# Patient Record
Sex: Female | Born: 2000 | Race: White | Hispanic: No | Marital: Single | Smoking: Never smoker
Health system: Southern US, Community
[De-identification: ages and names within clinical notes are randomized; demographics above are authoritative.]

## PROBLEM LIST (undated history)

## (undated) DIAGNOSIS — F32A Depression, unspecified: Secondary | ICD-10-CM

## (undated) DIAGNOSIS — M419 Scoliosis, unspecified: Secondary | ICD-10-CM

## (undated) DIAGNOSIS — T7840XA Allergy, unspecified, initial encounter: Secondary | ICD-10-CM

## (undated) DIAGNOSIS — F329 Major depressive disorder, single episode, unspecified: Secondary | ICD-10-CM

## (undated) DIAGNOSIS — J45909 Unspecified asthma, uncomplicated: Secondary | ICD-10-CM

## (undated) DIAGNOSIS — F319 Bipolar disorder, unspecified: Secondary | ICD-10-CM

## (undated) DIAGNOSIS — F419 Anxiety disorder, unspecified: Secondary | ICD-10-CM

## (undated) HISTORY — DX: Unspecified asthma, uncomplicated: J45.909

## (undated) HISTORY — DX: Allergy, unspecified, initial encounter: T78.40XA

## (undated) HISTORY — PX: TONSILLECTOMY: SUR1361

## (undated) HISTORY — DX: Bipolar disorder, unspecified: F31.9

## (undated) HISTORY — DX: Depression, unspecified: F32.A

## (undated) HISTORY — DX: Scoliosis, unspecified: M41.9

## (undated) HISTORY — DX: Anxiety disorder, unspecified: F41.9

## (undated) HISTORY — DX: Major depressive disorder, single episode, unspecified: F32.9

---
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
## 2021-01-18 ENCOUNTER — Other Ambulatory Visit: Payer: Self-pay | Admitting: Nurse Practitioner

## 2021-01-19 NOTE — Telephone Encounter (Signed)
Requested Prescriptions  Pending Prescriptions Disp Refills  . budesonide-formoterol (SYMBICORT) 160-4.5 MCG/ACT inhaler [Pharmacy Med Name: BUDESONIDE/FORM 160/4.5MCG(120 INH)] 10.2 g 5    Sig: INHALE 2 PUFFS INTO THE LUNGS TWICE DAILY     Pulmonology:  Combination Products Passed - 01/18/2021  6:27 PM      Passed - Valid encounter within last 12 months    Recent Outpatient Visits          2 months ago Migraine variant with headache   [facility] Larae Grooms, NP   3 months ago Screening for STD (sexually transmitted disease)   [facility] Larae Grooms, NP   6 months ago Annual physical exam   [facility] Larae Grooms, NP   7 months ago Migraine variant with headache   [name] Larae Grooms, NP   8 months ago Migraine variant with headache   [facility] Marjie Skiff, NP      Future Appointments            In 4 months Larae Grooms, NP [facility]

## 2021-01-27 ENCOUNTER — Ambulatory Visit: Payer: Self-pay

## 2021-01-27 NOTE — Telephone Encounter (Signed)
Pt. Started coughing last week. States cough is "actually better, but I'm having shortness of breath with exertion and I'm using my rescue inhaler more." Dry cough. Home COVID 19 test negative today. Appointment made for tomorrow. Instructed to go to ED for worsening of symptoms.  Reason for Disposition  [1] MILD difficulty breathing (e.g., minimal/no SOB at rest, SOB with walking, pulse <100) AND [2] still present when not coughing  Answer Assessment - Initial Assessment Questions 1. ONSET: "When did the cough begin?"      Last week  2. SEVERITY: "How bad is the cough today?"      Mild 3. SPUTUM: "Describe the color of your sputum" (none, dry cough; clear, white, yellow, green)     N/a 4. HEMOPTYSIS: "Are you coughing up any blood?" If so ask: "How much?" (flecks, streaks, tablespoons, etc.)     No 5. DIFFICULTY BREATHING: "Are you having difficulty breathing?" If Yes, ask: "How bad is it?" (e.g., mild, moderate, severe)    - MILD: No SOB at rest, mild SOB with walking, speaks normally in sentences, can lie down, no retractions, pulse < 100.    - MODERATE: SOB at rest, SOB with minimal exertion and prefers to sit, cannot lie down flat, speaks in phrases, mild retractions, audible wheezing, pulse 100-120.    - SEVERE: Very SOB at rest, speaks in single words, struggling to breathe, sitting hunched forward, retractions, pulse > 120      Minimal exertion - stairs 6. FEVER: "Do you have a fever?" If Yes, ask: "What is your temperature, how was it measured, and when did it start?"     No 7. CARDIAC HISTORY: "Do you have any history of heart disease?" (e.g., heart attack, congestive heart failure)      No 8. LUNG HISTORY: "Do you have any history of lung disease?"  (e.g., pulmonary embolus, asthma, emphysema)     Asthma 9. PE RISK FACTORS: "Do you have a history of blood clots?" (or: recent major surgery, recent prolonged travel, bedridden)     No 10. OTHER SYMPTOMS: "Do you have any other  symptoms?" (e.g., runny nose, wheezing, chest pain)       No 11. PREGNANCY: "Is there any chance you are pregnant?" "When was your last menstrual period?"       No 12. TRAVEL: "Have you traveled out of the country in the last month?" (e.g., travel history, exposures)       No  Protocols used: Cough - Acute Non-Productive-A-AH

## 2021-01-28 ENCOUNTER — Other Ambulatory Visit: Payer: Self-pay

## 2021-01-28 ENCOUNTER — Ambulatory Visit (INDEPENDENT_AMBULATORY_CARE_PROVIDER_SITE_OTHER): Payer: Medicaid Other | Admitting: Nurse Practitioner

## 2021-01-28 ENCOUNTER — Encounter: Payer: Self-pay | Admitting: Nurse Practitioner

## 2021-01-28 VITALS — BP 114/74 | HR 101 | Temp 98.1°F | Ht 64.5 in | Wt 131.0 lb

## 2021-01-28 DIAGNOSIS — J4541 Moderate persistent asthma with (acute) exacerbation: Secondary | ICD-10-CM

## 2021-01-28 MED ORDER — TRIAMCINOLONE ACETONIDE 40 MG/ML IJ SUSP
40.0000 mg | Freq: Once | INTRAMUSCULAR | Status: AC
  Administered 2021-01-28: 40 mg via INTRAMUSCULAR

## 2021-01-28 MED ORDER — PREDNISONE 10 MG PO TABS: ORAL_TABLET | ORAL | 0 refills | Status: DC

## 2021-01-28 MED ORDER — ALBUTEROL SULFATE (2.5 MG/3ML) 0.083% IN NEBU
2.5000 mg | INHALATION_SOLUTION | Freq: Once | RESPIRATORY_TRACT | Status: AC
Start: 2021-01-28 — End: 2021-01-28
  Administered 2021-01-28: 2.5 mg via RESPIRATORY_TRACT

## 2021-01-28 NOTE — Progress Notes (Signed)
Acute Office Visit  Subjective:    Patient ID: Kelsey Aguirre, female    DOB: [birth year], 20 y.o.   MRN: [id number]  Chief Complaint  Patient presents with   Cough    With shortness of breath. Sore throat last Thursday, with congestion that went away. Non productive cough. Home covid test- negative. No fever. Taking albuterol more frequently.    HPI Patient is in today for cough and shortness of breath since Thursday. She had a negative home covid-19 test yesterday. She states her symptoms started after spending the night with her dad who is a smoker.   UPPER RESPIRATORY TRACT INFECTION  Worst symptom: cough Cough: yes Shortness of breath: yes Wheezing: yes - intermittent Chest pain: no Chest tightness: no Chest congestion: no Nasal congestion: yes Runny nose: yes Post nasal drip: yes Sneezing: yes Sore throat: yes - improved Swollen glands: no Sinus pressure: no Headache: no Face pain: no Toothache: no Ear pain: no  Ear pressure: yes bilateral Eyes red/itching:no Eye drainage/crusting: no  Vomiting: no Rash: no Fatigue: yes Sick contacts: no Strep contacts: no  Context: worse Recurrent sinusitis: no Relief with OTC cold/cough medications: no  Treatments attempted:  flonase, allegra, increasing albuterol inhaler     Past Medical History:  Diagnosis Date   Allergy    Anxiety    Asthma    Depression    Scoliosis     Past Surgical History:  Procedure Laterality Date   TONSILLECTOMY      Family History  Problem Relation Age of Onset   Drug abuse Mother    Hepatitis C Mother    Hypertension Mother    Hepatitis B Father    HIV Father    Cancer Maternal Grandmother    Hypertension Maternal Grandmother    Osteoporosis Maternal Grandmother    Heart disease Maternal Grandfather    Lung disease Paternal Grandmother     Social History   Socioeconomic History   Marital status: Single    Spouse name: Not on file   Number of children: Not on  file   Years of education: Not on file   Highest education level: Not on file  Occupational History   Not on file  Tobacco Use   Smoking status: Never   Smokeless tobacco: Never  Vaping Use   Vaping Use: Never used  Substance and Sexual Activity   Alcohol use: No   Drug use: No   Sexual activity: Never  Other Topics Concern   Not on file  Social History Narrative   Not on file   Social Determinants of Health   Financial Resource Strain: Not on file  Food Insecurity: Not on file  Transportation Needs: Not on file  Physical Activity: Not on file  Stress: Not on file  Social Connections: Not on file  Intimate Partner Violence: Not on file    Outpatient Medications Prior to Visit  Medication Sig Dispense Refill   albuterol (VENTOLIN HFA) 108 (90 Base) MCG/ACT inhaler Inhale 2 puffs into the lungs every 6 (six) hours as needed for wheezing or shortness of breath. 18 g 5   amitriptyline (ELAVIL) 25 MG tablet Take 1 tablet (25 mg total) by mouth at bedtime. 90 tablet 2   budesonide-formoterol (SYMBICORT) 160-4.5 MCG/ACT inhaler INHALE 2 PUFFS INTO THE LUNGS TWICE DAILY 10.2 g 5   fexofenadine (ALLEGRA) 180 MG tablet Take 180 mg by mouth daily.     fluticasone (FLONASE) 50 MCG/ACT nasal spray SHAKE LIQUID AND  USE 1 SPRAY IN EACH NOSTRIL TWICE DAILY 48 g 6   lamoTRIgine (LAMICTAL) 200 MG tablet Take 300 mg by mouth daily. Takes 1.5 tablets     lurasidone (LATUDA) 40 MG TABS tablet Take 40 mg by mouth daily.     medroxyPROGESTERone (DEPO-PROVERA) 150 MG/ML injection Inject 150 mg into the muscle every 3 (three) months.     rizatriptan (MAXALT) 5 MG tablet Take 1 tablet (5 mg total) by mouth as needed for migraine. May repeat in 2 hours if needed 10 tablet 0   levonorgestrel-ethinyl estradiol (SEASONALE) 0.15-0.03 MG tablet Take 1 tablet by mouth daily. 91 tablet 4   montelukast (SINGULAIR) 10 MG tablet Take 1 tablet (10 mg total) by mouth at bedtime. (Patient not taking: Reported on  01/28/2021) 90 tablet 1   No facility-administered medications prior to visit.    Allergies  Allergen Reactions   Zyrtec [Cetirizine] Nausea And Vomiting    Review of Systems  Constitutional:  Positive for fatigue. Negative for fever.  HENT:  Positive for congestion, ear pain (pressure), postnasal drip, rhinorrhea and sneezing. Negative for sinus pressure and sore throat (resolved).   Eyes: Negative.   Respiratory:  Positive for cough and shortness of breath.   Cardiovascular: Negative.   Gastrointestinal: Negative.   Genitourinary: Negative.   Musculoskeletal: Negative.   Skin: Negative.   Neurological: Negative.       Objective:    Physical Exam Vitals and nursing note reviewed.  Constitutional:      General: She is not in acute distress.    Appearance: Normal appearance.  HENT:     Head: Normocephalic.  Eyes:     Conjunctiva/sclera: Conjunctivae normal.  Cardiovascular:     Rate and Rhythm: Normal rate and regular rhythm.     Pulses: Normal pulses.     Heart sounds: Normal heart sounds.  Pulmonary:     Effort: Pulmonary effort is normal.     Breath sounds: Normal breath sounds.     Comments: Able to talk in complete sentences.  Abdominal:     Palpations: Abdomen is soft.     Tenderness: There is no abdominal tenderness.  Musculoskeletal:     Cervical back: Normal range of motion.  Skin:    General: Skin is warm.  Neurological:     General: No focal deficit present.     Mental Status: She is alert and oriented to person, place, and time.  Psychiatric:        Mood and Affect: Mood normal.        Behavior: Behavior normal.        Thought Content: Thought content normal.        Judgment: Judgment normal.    BP 114/74   Pulse (!) 101   Temp 98.1 F (36.7 C) (Oral)   Ht 5' 4.5" (1.638 m)   Wt 131 lb (59.4 kg)   LMP 01/07/2021   SpO2 98%   BMI 22.14 kg/m  Wt Readings from Last 3 Encounters:  01/28/21 131 lb (59.4 kg) (58 %, Z= 0.21)*  11/10/20 124 lb  (56.2 kg) (46 %, Z= -0.10)*  10/01/20 128 lb 6 oz (58.2 kg) (55 %, Z= 0.13)*   * Growth percentiles are based on CDC (Girls, 2-20 Years) data.    Health Maintenance Due  Topic Date Due   Pneumococcal Vaccine 58-24 Years old (1 - PPSV23 if available, else PCV20) 01/06/2004   COVID-19 Vaccine (4 - Booster for Pfizer series) 06/03/2020  INFLUENZA VACCINE  11/09/2020    There are no preventive care reminders to display for this patient.   Lab Results  Component Value Date   TSH 0.588 04/14/2020   Lab Results  Component Value Date   WBC 9.8 04/14/2020   HGB 14.0 04/14/2020   HCT 41.2 04/14/2020   MCV 91 04/14/2020   PLT 289 04/14/2020   Lab Results  Component Value Date   NA 137 04/14/2020   K 4.1 04/14/2020   CO2 21 04/14/2020   GLUCOSE 74 04/14/2020   BUN 13 04/14/2020   CREATININE 0.69 04/14/2020   BILITOT 0.6 04/14/2020   ALKPHOS 70 04/14/2020   AST 15 04/14/2020   ALT 11 04/14/2020   PROT 7.5 04/14/2020   ALBUMIN 4.7 04/14/2020   CALCIUM 9.6 04/14/2020   ANIONGAP 10 08/12/2015   Lab Results  Component Value Date   CHOL 133 07/04/2019   Lab Results  Component Value Date   HDL 44 07/04/2019   Lab Results  Component Value Date   LDLCALC 77 07/04/2019   Lab Results  Component Value Date   TRIG 55 07/04/2019   No results found for: CHOLHDL No results found for: UVOZ3G     Assessment & Plan:   Problem List Items Addressed This Visit       Respiratory   Moderate persistent asthma with exacerbation - Primary    Chronic, with acute exacerbation. Discussed triggers and avoiding cigarette smoke. Will give albuterol nebulizer and kenalog IM in office. Shortness of breath improved with albuterol nebulizer. Treat with prednisone taper starting tomorrow morning. She will be heading back to college in [place]. Follow up with urgent care or school health center if symptoms are not improving. ER precautions given.       Relevant Medications   predniSONE  (DELTASONE) 10 MG tablet   triamcinolone acetonide (KENALOG-40) injection 40 mg   albuterol (PROVENTIL) (2.5 MG/3ML) 0.083% nebulizer solution 2.5 mg     Meds ordered this encounter  Medications   predniSONE (DELTASONE) 10 MG tablet    Sig: Take 6 tablets today and tomorrow, then 5 tablets the next 2 days, then decrease by 1 tablet every other day until gone    Dispense:  42 tablet    Refill:  0   triamcinolone acetonide (KENALOG-40) injection 40 mg   albuterol (PROVENTIL) (2.5 MG/3ML) 0.083% nebulizer solution 2.5 mg      Gerre Scull, NP

## 2021-01-28 NOTE — Assessment & Plan Note (Addendum)
Chronic, with acute exacerbation. Discussed triggers and avoiding cigarette smoke. Will give albuterol nebulizer and kenalog IM in office. Shortness of breath improved with albuterol nebulizer. Treat with prednisone taper starting tomorrow morning. She will be heading back to college in [place]. Follow up with urgent care or school health center if symptoms are not improving. ER precautions given.

## 2021-03-21 ENCOUNTER — Encounter: Payer: Self-pay | Admitting: Nurse Practitioner

## 2021-04-01 ENCOUNTER — Ambulatory Visit: Payer: Medicaid Other

## 2021-04-01 ENCOUNTER — Encounter: Payer: Self-pay | Admitting: Nurse Practitioner

## 2021-04-01 ENCOUNTER — Other Ambulatory Visit: Payer: Self-pay

## 2021-04-01 ENCOUNTER — Ambulatory Visit (INDEPENDENT_AMBULATORY_CARE_PROVIDER_SITE_OTHER): Payer: Medicaid Other | Admitting: Nurse Practitioner

## 2021-04-01 VITALS — BP 102/64 | HR 85 | Temp 98.3°F | Wt 147.4 lb

## 2021-04-01 DIAGNOSIS — Z308 Encounter for other contraceptive management: Secondary | ICD-10-CM | POA: Diagnosis not present

## 2021-04-01 DIAGNOSIS — K582 Mixed irritable bowel syndrome: Secondary | ICD-10-CM | POA: Diagnosis not present

## 2021-04-01 DIAGNOSIS — F319 Bipolar disorder, unspecified: Secondary | ICD-10-CM

## 2021-04-01 MED ORDER — MEDROXYPROGESTERONE ACETATE 150 MG/ML IM SUSP
150.0000 mg | Freq: Once | INTRAMUSCULAR | Status: AC
Start: 2021-04-01 — End: 2021-04-01
  Administered 2021-04-01: 150 mg via INTRAMUSCULAR

## 2021-04-01 NOTE — Assessment & Plan Note (Signed)
Patient states her mood is controlled. She is seeing psychiatry.  Doing well with Zyprexa and Lithium.

## 2021-04-01 NOTE — Progress Notes (Signed)
BP 102/64    Pulse 85    Temp 98.3 F (36.8 C) (Oral)    Wt 147 lb 6.4 oz (66.9 kg)    SpO2 98%    BMI 24.91 kg/m    Subjective:    Patient ID: Kelsey Aguirre, female    DOB: 07-Apr-2001, 20 y.o.   MRN: [id number]  HPI: Kelsey Aguirre is a 20 y.o. female  Chief Complaint  Patient presents with   Irritable Bowel Syndrome    Pt states she has been having trouble with her bowels. States sometimes she is constipated and sometimes cant hold her stools. States this has been going on for a close to a year now.    Patient presents to clinic with complaints of constipation and diarrhea.  She gets stomach cramps.  Patient's psychiatrist put her on Lithium and was told that the diarrhea was normal.  However they think she may have IBS.     Relevant past medical, surgical, family and social history reviewed and updated as indicated. Interim medical history since our last visit reviewed. Allergies and medications reviewed and updated.  Review of Systems  Gastrointestinal:  Positive for abdominal pain, constipation and diarrhea.   Per HPI unless specifically indicated above     Objective:    BP 102/64    Pulse 85    Temp 98.3 F (36.8 C) (Oral)    Wt 147 lb 6.4 oz (66.9 kg)    SpO2 98%    BMI 24.91 kg/m   Wt Readings from Last 3 Encounters:  04/01/21 147 lb 6.4 oz (66.9 kg) (79 %, Z= 0.81)*  01/28/21 131 lb (59.4 kg) (58 %, Z= 0.21)*  11/10/20 124 lb (56.2 kg) (46 %, Z= -0.10)*   * Growth percentiles are based on CDC (Girls, 2-20 Years) data.    Physical Exam Vitals and nursing note reviewed.  Constitutional:      General: She is not in acute distress.    Appearance: Normal appearance. She is normal weight. She is not ill-appearing, toxic-appearing or diaphoretic.  HENT:     Head: Normocephalic.     Right Ear: External ear normal.     Left Ear: External ear normal.     Nose: Nose normal.     Mouth/Throat:     Mouth: Mucous membranes are moist.     Pharynx: Oropharynx is  clear.  Eyes:     General:        Right eye: No discharge.        Left eye: No discharge.     Extraocular Movements: Extraocular movements intact.     Conjunctiva/sclera: Conjunctivae normal.     Pupils: Pupils are equal, round, and reactive to light.  Cardiovascular:     Rate and Rhythm: Normal rate and regular rhythm.     Heart sounds: No murmur heard. Pulmonary:     Effort: Pulmonary effort is normal. No respiratory distress.     Breath sounds: Normal breath sounds. No wheezing or rales.  Abdominal:     General: Abdomen is flat. Bowel sounds are normal. There is no distension.     Palpations: Abdomen is soft.     Tenderness: There is no abdominal tenderness. There is no guarding.  Musculoskeletal:     Cervical back: Normal range of motion and neck supple.  Skin:    General: Skin is warm and dry.     Capillary Refill: Capillary refill takes less than 2 seconds.  Neurological:  General: No focal deficit present.     Mental Status: She is alert and oriented to person, place, and time. Mental status is at baseline.  Psychiatric:        Mood and Affect: Mood normal.        Behavior: Behavior normal.        Thought Content: Thought content normal.        Judgment: Judgment normal.    Results for orders placed or performed in visit on 10/01/20  Chlamydia/Gonococcus/Trichomonas, NAA([facility])   Specimen: Urine   UR  Result Value Ref Range   Chlamydia by NAA Negative Negative   Gonococcus by NAA Negative Negative   Trich vag by NAA Negative Negative  HIV Antibody (routine testing w rflx)  Result Value Ref Range   HIV Screen 4th Generation wRfx Non Reactive Non Reactive  RPR  Result Value Ref Range   RPR Ser Ql Non Reactive Non Reactive  HSV(herpes smplx)abs-1+2(IgG+IgM)-bld  Result Value Ref Range   HSVI/II Comb IgM <0.91 0.00 - 0.90 Ratio   HSV 1 Glycoprotein G Ab, IgG <0.91 0.00 - 0.90 index   HSV 2 IgG, Type Spec <0.91 0.00 - 0.90 index  Pregnancy, urine  Result  Value Ref Range   Preg Test, Ur Negative Negative      Assessment & Plan:   Problem List Items Addressed This Visit       Other   Bipolar 1 disorder (HCC) - Primary    Patient states her mood is controlled. She is seeing psychiatry.  Doing well with Zyprexa and Lithium.      Other Visit Diagnoses     Irritable bowel syndrome with both constipation and diarrhea       Referral to GI placed. Discussed FODMAP diet. Increase water intake.   Relevant Orders   Ambulatory referral to Gastroenterology   Encounter for other contraceptive management       Relevant Medications   medroxyPROGESTERone (DEPO-PROVERA) injection 150 mg (Completed)        Follow up plan: No follow-ups on file.

## 2021-04-23 ENCOUNTER — Encounter: Payer: Self-pay | Admitting: Nurse Practitioner

## 2021-04-23 MED ORDER — LEVONORGEST-ETH ESTRAD 91-DAY 0.15-0.03 MG PO TABS: 1.0000 | ORAL_TABLET | Freq: Every day | ORAL | 3 refills | Status: DC

## 2021-06-14 ENCOUNTER — Ambulatory Visit (INDEPENDENT_AMBULATORY_CARE_PROVIDER_SITE_OTHER): Payer: Medicaid Other | Admitting: Nurse Practitioner

## 2021-06-14 ENCOUNTER — Other Ambulatory Visit: Payer: Self-pay

## 2021-06-14 ENCOUNTER — Encounter: Payer: Self-pay | Admitting: Nurse Practitioner

## 2021-06-14 VITALS — BP 106/52 | HR 83 | Temp 98.8°F | Wt 162.8 lb

## 2021-06-14 DIAGNOSIS — F419 Anxiety disorder, unspecified: Secondary | ICD-10-CM | POA: Diagnosis not present

## 2021-06-14 DIAGNOSIS — F3341 Major depressive disorder, recurrent, in partial remission: Secondary | ICD-10-CM

## 2021-06-14 DIAGNOSIS — F319 Bipolar disorder, unspecified: Secondary | ICD-10-CM

## 2021-06-14 NOTE — Progress Notes (Signed)
? ?BP (!) 106/52   Pulse 83   Temp 98.8 ?F (37.1 ?C) (Oral)   Wt 162 lb 12.8 oz (73.8 kg)   LMP  (LMP Unknown)   SpO2 99%   BMI 27.51 kg/m?   ? ?Subjective:  ? ? Patient ID: Kelsey Aguirre, female    DOB: 2000-07-27, 21 y.o.   MRN: [id number] ? ?HPI: ?Kelsey Aguirre is a 21 y.o. female ? ?Chief Complaint  ?Patient presents with  ? Depression  ? Anxiety  ? ?MOOD ?Patient states things are going well.  They recently adjusted her lithium dose which is working well for her.  She see's her psychiatrist this afternoon and wants to discuss changing her zyprexa due to weight gain.   ? ? ?Relevant past medical, surgical, family and social history reviewed and updated as indicated. Interim medical history since our last visit reviewed. ?Allergies and medications reviewed and updated. ? ?Review of Systems  ?Psychiatric/Behavioral:  Positive for dysphoric mood. Negative for suicidal ideas. The patient is nervous/anxious.   ? ?Per HPI unless specifically indicated above ? ?   ?Objective:  ?  ?BP (!) 106/52   Pulse 83   Temp 98.8 ?F (37.1 ?C) (Oral)   Wt 162 lb 12.8 oz (73.8 kg)   LMP  (LMP Unknown)   SpO2 99%   BMI 27.51 kg/m?   ?Wt Readings from Last 3 Encounters:  ?06/14/21 162 lb 12.8 oz (73.8 kg) (89 %, Z= 1.23)*  ?04/01/21 147 lb 6.4 oz (66.9 kg) (79 %, Z= 0.81)*  ?01/28/21 131 lb (59.4 kg) (58 %, Z= 0.21)*  ? ?* Growth percentiles are based on CDC (Girls, 2-20 Years) data.  ?  ?Physical Exam ?Vitals and nursing note reviewed.  ?Constitutional:   ?   General: She is not in acute distress. ?   Appearance: Normal appearance. She is normal weight. She is not ill-appearing, toxic-appearing or diaphoretic.  ?HENT:  ?   Head: Normocephalic.  ?   Right Ear: External ear normal.  ?   Left Ear: External ear normal.  ?   Nose: Nose normal.  ?   Mouth/Throat:  ?   Mouth: Mucous membranes are moist.  ?   Pharynx: Oropharynx is clear.  ?Eyes:  ?   General:     ?   Right eye: No discharge.     ?   Left eye: No discharge.   ?   Extraocular Movements: Extraocular movements intact.  ?   Conjunctiva/sclera: Conjunctivae normal.  ?   Pupils: Pupils are equal, round, and reactive to light.  ?Cardiovascular:  ?   Rate and Rhythm: Normal rate and regular rhythm.  ?   Heart sounds: No murmur heard. ?Pulmonary:  ?   Effort: Pulmonary effort is normal. No respiratory distress.  ?   Breath sounds: Normal breath sounds. No wheezing or rales.  ?Musculoskeletal:  ?   Cervical back: Normal range of motion and neck supple.  ?Skin: ?   General: Skin is warm and dry.  ?   Capillary Refill: Capillary refill takes less than 2 seconds.  ?Neurological:  ?   General: No focal deficit present.  ?   Mental Status: She is alert and oriented to person, place, and time. Mental status is at baseline.  ?Psychiatric:     ?   Mood and Affect: Mood normal.     ?   Behavior: Behavior normal.     ?   Thought Content: Thought content normal.     ?  Judgment: Judgment normal.  ? ? ?Results for orders placed or performed in visit on 10/01/20  ?Chlamydia/Gonococcus/Trichomonas, NAA([facility])  ? Specimen: Urine  ? UR  ?Result Value Ref Range  ? Chlamydia by NAA Negative Negative  ? Gonococcus by NAA Negative Negative  ? Trich vag by NAA Negative Negative  ?HIV Antibody (routine testing w rflx)  ?Result Value Ref Range  ? HIV Screen 4th Generation wRfx Non Reactive Non Reactive  ?RPR  ?Result Value Ref Range  ? RPR Ser Ql Non Reactive Non Reactive  ?HSV(herpes smplx)abs-1+2(IgG+IgM)-bld  ?Result Value Ref Range  ? HSVI/II Comb IgM <0.91 0.00 - 0.90 Ratio  ? HSV 1 Glycoprotein G Ab, IgG <0.91 0.00 - 0.90 index  ? HSV 2 IgG, Type Spec <0.91 0.00 - 0.90 index  ?Pregnancy, urine  ?Result Value Ref Range  ? Preg Test, Ur Negative Negative  ? ?   ?Assessment & Plan:  ? ?Problem List Items Addressed This Visit   ? ?  ? Other  ? Anxiety  ?  Chronic.  Controlled.  Continue with current medication regimen on Lithium and Zyrexa.  Labs ordered today.  Return to clinic in 6 months for  reevaluation.  Call sooner if concerns arise.  ? ?  ?  ? Depression  ?  Chronic.  Controlled.  Continue with current medication regimen on Lithium and Zyrexa.  Labs ordered today.  Return to clinic in 6 months for reevaluation.  Call sooner if concerns arise.  ?  ?  ? Bipolar 1 disorder ([place]) - Primary  ?  Chronic.  Controlled.  Continue with current medication regimen on Lithium and Zyrexa.  Labs ordered today.  Return to clinic in 6 months for reevaluation.  Call sooner if concerns arise.  ?  ?  ?  ? ?Follow up plan: ?Return in about 6 months (around 12/15/2021) for Physical and Fasting labs. ? ? ? ? ? ?

## 2021-06-14 NOTE — Assessment & Plan Note (Signed)
Chronic.  Controlled.  Continue with current medication regimen on Lithium and Zyrexa.  Labs ordered today.  Return to clinic in 6 months for reevaluation.  Call sooner if concerns arise.  ?

## 2021-06-14 NOTE — Assessment & Plan Note (Signed)
Chronic.  Controlled.  Continue with current medication regimen on Lithium and Zyrexa.  Labs ordered today.  Return to clinic in 6 months for reevaluation.  Call sooner if concerns arise.  ?

## 2021-06-16 ENCOUNTER — Encounter: Payer: Self-pay | Admitting: Gastroenterology

## 2021-06-16 ENCOUNTER — Ambulatory Visit (INDEPENDENT_AMBULATORY_CARE_PROVIDER_SITE_OTHER): Payer: Medicaid Other | Admitting: Gastroenterology

## 2021-06-16 ENCOUNTER — Other Ambulatory Visit: Payer: Self-pay

## 2021-06-16 VITALS — BP 129/80 | HR 71 | Temp 98.6°F | Ht 63.0 in | Wt 164.0 lb

## 2021-06-16 DIAGNOSIS — K219 Gastro-esophageal reflux disease without esophagitis: Secondary | ICD-10-CM | POA: Diagnosis not present

## 2021-06-16 DIAGNOSIS — K582 Mixed irritable bowel syndrome: Secondary | ICD-10-CM

## 2021-06-16 MED ORDER — FAMOTIDINE 20 MG PO TABS: 20.0000 mg | ORAL_TABLET | Freq: Two times a day (BID) | ORAL | 5 refills | Status: DC

## 2021-06-16 MED ORDER — DICYCLOMINE HCL 10 MG PO CAPS: 10.0000 mg | ORAL_CAPSULE | Freq: Three times a day (TID) | ORAL | 5 refills | Status: DC

## 2021-06-16 NOTE — Progress Notes (Signed)
? ? ?Gastroenterology Consultation ? ?Referring Provider:     Larae Grooms, NP ?Primary Care Physician:  Larae Grooms, NP ?Primary Gastroenterologist:  Dr. Servando Snare     ?Reason for Consultation:     IBS ?      ? HPI:   ?Kelsey Aguirre is a 21 y.o. y/o female referred for consultation & management of IBS by Dr. Larae Grooms, NP.  This patient comes in today with a long history of alternating diarrhea and constipation.  The patient also reports that she has been having heartburn.  The patient reports that she has bipolar disease and is on multiple medications for that including lithium and has tried tricyclic antidepressants in the past for her migraine headaches but states that she got bad side effects from it so cannot take tricyclic's anymore.  The patient also reports that she has tried a low FODMAP diet in the past and she could not tolerated.  The patient denies any worry symptoms such as rectal bleeding or diarrhea waking her up from sleep.  She also denies any unexplained weight loss and states she has been gaining weight due to her new medication.  There is no report of any first-degree relatives with any colon cancer or colon polyps although she states that she had a grandmother in her 58s who had cancer that she thinks was colon that went to her pancreas.  The patient also reports that she has been having some crampy abdominal pain.  She has not previously been seen by a gastroenterologist for the symptoms.  She has been diagnosed with irritable bowel syndrome by her primary care provider. ?In addition to her irritable bowel syndrome the patient also reports that she has frequent heartburn that she takes Tums for.  There is no report of any dysphagia or hematemesis. ? ?Past Medical History:  ?Diagnosis Date  ? Allergy   ? Anxiety   ? Asthma   ? Bipolar 1 disorder (HCC)   ? Depression   ? Scoliosis   ? ? ?Past Surgical History:  ?Procedure Laterality Date  ? TONSILLECTOMY    ? ? ?Prior to  Admission medications   ?Medication Sig Start Date End Date Taking? Authorizing Provider  ?albuterol (VENTOLIN HFA) 108 (90 Base) MCG/ACT inhaler Inhale 2 puffs into the lungs every 6 (six) hours as needed for wheezing or shortness of breath. 07/23/20   Larae Grooms, NP  ?budesonide-formoterol [facility]) 160-4.5 MCG/ACT inhaler INHALE 2 PUFFS INTO THE LUNGS TWICE DAILY 01/19/21   Larae Grooms, NP  ?fluticasone (FLONASE) 50 MCG/ACT nasal spray SHAKE LIQUID AND USE 1 SPRAY IN EACH NOSTRIL TWICE DAILY 06/15/20   Larae Grooms, NP  ?levonorgestrel-ethinyl estradiol (SEASONALE) 0.15-0.03 MG tablet Take 1 tablet by mouth daily. 04/23/21   Larae Grooms, NP  ?lithium carbonate 150 MG capsule Take 1 capsule by mouth in the morning and at bedtime. 06/10/21   [provider]  ?lithium carbonate 300 MG capsule Take 300 mg by mouth 2 (two) times daily. 03/03/21   [provider]  ?OLANZapine (ZYPREXA) 10 MG tablet Take 10 mg by mouth at bedtime. 03/17/21   [provider]  ?rizatriptan (MAXALT) 10 MG tablet Take 10 mg by mouth 2 (two) times daily as needed. 03/30/21   [provider]  ? ? ?Family History  ?Problem Relation Age of Onset  ? Drug abuse Mother   ? Hepatitis C Mother   ? Hypertension Mother   ? Hepatitis B Father   ? HIV Father   ?  Cancer Maternal Grandmother   ? Hypertension Maternal Grandmother   ? Osteoporosis Maternal Grandmother   ? Heart disease Maternal Grandfather   ? Lung disease Paternal Grandmother   ?  ? ?Social History  ? ?Tobacco Use  ? Smoking status: Never  ? Smokeless tobacco: Never  ?Vaping Use  ? Vaping Use: Never used  ?Substance Use Topics  ? Alcohol use: No  ? Drug use: No  ? ? ?Allergies as of 06/16/2021 - Review Complete 06/14/2021  ?Allergen Reaction Noted  ? Zyrtec [cetirizine] Nausea And Vomiting 05/18/2018  ? ? ?Review of Systems:    ?All systems reviewed and negative except where noted in HPI. ? ? Physical Exam:  ?LMP  (LMP Unknown)  ?No  LMP recorded (lmp unknown). (Menstrual status: Oral contraceptives). ?General:   Alert,  Well-developed, well-nourished, pleasant and cooperative in NAD ?Head:  Normocephalic and atraumatic. ?Eyes:  Sclera clear, no icterus.   Conjunctiva pink. ?Ears:  Normal auditory acuity. ?Neck:  Supple; no masses or thyromegaly. ?Lungs:  Respirations even and unlabored.  Clear throughout to auscultation.   No wheezes, crackles, or rhonchi. No acute distress. ?Heart:  Regular rate and rhythm; no murmurs, clicks, rubs, or gallops. ?Abdomen:  Normal bowel sounds.  No bruits.  Soft, non-tender and non-distended without masses, hepatosplenomegaly or hernias noted.  No guarding or rebound tenderness.  Negative Carnett sign.   ?Rectal:  Deferred.  ?Pulses:  Normal pulses noted. ?Extremities:  No clubbing or edema.  No cyanosis. ?Neurologic:  Alert and oriented x3;  grossly normal neurologically. ?Skin:  Intact without significant lesions or rashes.  No jaundice. ?Lymph Nodes:  No significant cervical adenopathy. ?Psych:  Alert and cooperative. Normal mood and affect. ? ?Imaging Studies: ?No results found. ? ?Assessment and Plan:  ? ?Kelsey Aguirre is a 21 y.o. y/o female who comes in today with a history of irritable bowel syndrome with alternating diarrhea and constipation.  The patient has been told to increase fiber in her diet.  The patient has also been started on a low-dose of dicyclomine and although there is some interaction with one of the medications that can cause hypothermia the patient has been told to start off with just 1 pill a day and see if she has any side effects and then she can increase it gradually.  She has been told to stop the medication if she does have any side effects.  She will also try and avoid foods that may cause her more gas and bloating since she states that she was not able to tolerate a low FODMAP diet in the past.  The patient also has intermittent heartburn that she takes Tums for.  The  patient will be started on famotidine 20 mg twice a day for the heartburn.  The patient has been explained the plan and agrees with it. ? ? ? ?Midge Minium, MD. Clementeen Graham ? ? ? Note: This dictation was prepared with Dragon dictation along with smaller phrase technology. Any transcriptional errors that result from this process are unintentional.   ?

## 2021-07-09 ENCOUNTER — Encounter: Payer: Self-pay | Admitting: Gastroenterology

## 2021-07-12 ENCOUNTER — Ambulatory Visit (INDEPENDENT_AMBULATORY_CARE_PROVIDER_SITE_OTHER): Payer: Medicaid Other | Admitting: Nurse Practitioner

## 2021-07-12 ENCOUNTER — Encounter: Payer: Self-pay | Admitting: Nurse Practitioner

## 2021-07-12 VITALS — BP 127/73 | HR 83 | Temp 98.7°F | Wt 159.2 lb

## 2021-07-12 DIAGNOSIS — R112 Nausea with vomiting, unspecified: Secondary | ICD-10-CM

## 2021-07-12 DIAGNOSIS — Z309 Encounter for contraceptive management, unspecified: Secondary | ICD-10-CM | POA: Diagnosis not present

## 2021-07-12 NOTE — Progress Notes (Signed)
? ?BP 127/73   Pulse 83   Temp 98.7 ?F (37.1 ?C) (Oral)   Wt 159 lb 3.2 oz (72.2 kg)   LMP  (LMP Unknown)   SpO2 99%   BMI 28.20 kg/m?   ? ?Subjective:  ? ? Patient ID: Kelsey Aguirre, female    DOB: Aug 02, 2000, 21 y.o.   MRN: [id number] ? ?HPI: ?Kelsey Aguirre is a 21 y.o. female ? ?Chief Complaint  ?Patient presents with  ? Emesis  ?  Nausea and vomiting x 1 week. Some coughing. Vomiting in the middle of the night mainly per patient.   ? Nausea  ? ?ABDOMINAL ISSUES ?Duration:  1 week ?Nature:  nausea ?Location:  difficulty eating   ?Severity: moderate  ?Radiation: no ?Episode duration: ?Frequency: intermittent ?Alleviating factors: ?Aggravating factors: ?Treatments attempted: none ?Constipation: no ?Diarrhea: no ?Episodes of diarrhea/day: ?Mucous in the stool: no ?Heartburn: yes ?Bloating:no ?Flatulence: no ?Nausea: yes ?Vomiting: yes ?Episodes of vomit/day: ?Melena or hematochezia: no ?Rash: no ?Jaundice: no ?Fever: no ?Weight loss: no ? ?Relevant past medical, surgical, family and social history reviewed and updated as indicated. Interim medical history since our last visit reviewed. ?Allergies and medications reviewed and updated. ? ?Review of Systems  ?Gastrointestinal:  Positive for nausea and vomiting. Negative for abdominal pain, constipation and diarrhea.  ? ?Per HPI unless specifically indicated above ? ?   ?Objective:  ?  ?BP 127/73   Pulse 83   Temp 98.7 ?F (37.1 ?C) (Oral)   Wt 159 lb 3.2 oz (72.2 kg)   LMP  (LMP Unknown)   SpO2 99%   BMI 28.20 kg/m?   ?Wt Readings from Last 3 Encounters:  ?07/12/21 159 lb 3.2 oz (72.2 kg) (87 %, Z= 1.13)*  ?06/16/21 164 lb (74.4 kg) (90 %, Z= 1.26)*  ?06/14/21 162 lb 12.8 oz (73.8 kg) (89 %, Z= 1.23)*  ? ?* Growth percentiles are based on CDC (Girls, 2-20 Years) data.  ?  ?Physical Exam ?Vitals and nursing note reviewed.  ?Constitutional:   ?   General: She is not in acute distress. ?   Appearance: Normal appearance. She is normal weight. She is not  ill-appearing, toxic-appearing or diaphoretic.  ?HENT:  ?   Head: Normocephalic.  ?   Right Ear: External ear normal.  ?   Left Ear: External ear normal.  ?   Nose: Nose normal.  ?   Mouth/Throat:  ?   Mouth: Mucous membranes are moist.  ?   Pharynx: Oropharynx is clear.  ?Eyes:  ?   General:     ?   Right eye: No discharge.     ?   Left eye: No discharge.  ?   Extraocular Movements: Extraocular movements intact.  ?   Conjunctiva/sclera: Conjunctivae normal.  ?   Pupils: Pupils are equal, round, and reactive to light.  ?Cardiovascular:  ?   Rate and Rhythm: Normal rate and regular rhythm.  ?   Heart sounds: No murmur heard. ?Pulmonary:  ?   Effort: Pulmonary effort is normal. No respiratory distress.  ?   Breath sounds: Normal breath sounds. No wheezing or rales.  ?Musculoskeletal:  ?   Cervical back: Normal range of motion and neck supple.  ?Skin: ?   General: Skin is warm and dry.  ?   Capillary Refill: Capillary refill takes less than 2 seconds.  ?Neurological:  ?   General: No focal deficit present.  ?   Mental Status: She is alert and oriented to  person, place, and time. Mental status is at baseline.  ?Psychiatric:     ?   Mood and Affect: Mood normal.     ?   Behavior: Behavior normal.     ?   Thought Content: Thought content normal.     ?   Judgment: Judgment normal.  ? ? ?Results for orders placed or performed in visit on 10/01/20  ?Chlamydia/Gonococcus/Trichomonas, NAA([facility])  ? Specimen: Urine  ? UR  ?Result Value Ref Range  ? Chlamydia by NAA Negative Negative  ? Gonococcus by NAA Negative Negative  ? Trich vag by NAA Negative Negative  ?HIV Antibody (routine testing w rflx)  ?Result Value Ref Range  ? HIV Screen 4th Generation wRfx Non Reactive Non Reactive  ?RPR  ?Result Value Ref Range  ? RPR Ser Ql Non Reactive Non Reactive  ?HSV(herpes smplx)abs-1+2(IgG+IgM)-bld  ?Result Value Ref Range  ? HSVI/II Comb IgM <0.91 0.00 - 0.90 Ratio  ? HSV 1 Glycoprotein G Ab, IgG <0.91 0.00 - 0.90 index  ? HSV 2 IgG,  Type Spec <0.91 0.00 - 0.90 index  ?Pregnancy, urine  ?Result Value Ref Range  ? Preg Test, Ur Negative Negative  ? ?   ?Assessment & Plan:  ? ?Problem List Items Addressed This Visit   ?None ?Visit Diagnoses   ? ? Encounter for contraceptive management, unspecified type    -  Primary  ? Would like to go back on Depo.  Will wait until she is done with her pack to restart DEPO. Urine pregnancy ordered to complete before first injection.  ? Relevant Orders  ? POCT urine pregnancy  ? Pregnancy, urine  ? Nausea and vomiting, unspecified vomiting type      ? Started after starting Vraylar.  Recommend following up with Pyschiatrist to see if medication can be changed back to Zyprexa.  FU if symptoms worsen.  ? ?  ?  ? ?Follow up plan: ?Return if symptoms worsen or fail to improve. ? ? ? ? ? ?

## 2021-07-23 ENCOUNTER — Ambulatory Visit (INDEPENDENT_AMBULATORY_CARE_PROVIDER_SITE_OTHER): Payer: Medicaid Other

## 2021-07-23 DIAGNOSIS — Z3042 Encounter for surveillance of injectable contraceptive: Secondary | ICD-10-CM | POA: Diagnosis not present

## 2021-07-23 DIAGNOSIS — Z309 Encounter for contraceptive management, unspecified: Secondary | ICD-10-CM | POA: Diagnosis not present

## 2021-07-23 LAB — PREGNANCY, URINE: Preg Test, Ur: NEGATIVE

## 2021-07-23 MED ORDER — MEDROXYPROGESTERONE ACETATE 150 MG/ML IM SUSP
150.0000 mg | Freq: Once | INTRAMUSCULAR | Status: AC
  Administered 2021-07-23: 150 mg via INTRAMUSCULAR

## 2021-07-23 NOTE — Progress Notes (Signed)
Patient presented today for Depo-Provera injection. Patient received in Left Upper Outer Quadrant, patient tolerated well.  ?

## 2021-07-29 ENCOUNTER — Other Ambulatory Visit: Payer: Self-pay | Admitting: Nurse Practitioner

## 2021-07-29 DIAGNOSIS — R9082 White matter disease, unspecified: Secondary | ICD-10-CM

## 2021-08-05 ENCOUNTER — Ambulatory Visit
Admission: RE | Admit: 2021-08-05 | Discharge: 2021-08-05 | Disposition: A | Discharge status: Home / Self Care | Payer: Medicaid Other | Source: Ambulatory Visit | Attending: Nurse Practitioner | Admitting: Nurse Practitioner

## 2021-08-05 DIAGNOSIS — R9082 White matter disease, unspecified: Secondary | ICD-10-CM | POA: Insufficient documentation

## 2021-08-05 MED ORDER — GADOBUTROL 1 MMOL/ML IV SOLN
7.0000 mL | Freq: Once | INTRAVENOUS | Status: AC | PRN
  Administered 2021-08-05: 7 mL via INTRAVENOUS

## 2021-10-08 ENCOUNTER — Ambulatory Visit (INDEPENDENT_AMBULATORY_CARE_PROVIDER_SITE_OTHER): Payer: Medicaid Other

## 2021-10-08 DIAGNOSIS — Z309 Encounter for contraceptive management, unspecified: Secondary | ICD-10-CM

## 2021-10-08 DIAGNOSIS — Z3042 Encounter for surveillance of injectable contraceptive: Secondary | ICD-10-CM

## 2021-10-08 MED ORDER — MEDROXYPROGESTERONE ACETATE 150 MG/ML IM SUSP
150.0000 mg | INTRAMUSCULAR | Status: AC
  Administered 2021-10-08 – 2022-06-21 (×3): 150 mg via INTRAMUSCULAR

## 2021-10-20 ENCOUNTER — Encounter: Payer: Self-pay | Admitting: Nurse Practitioner

## 2021-10-20 ENCOUNTER — Ambulatory Visit: Payer: Self-pay

## 2021-10-20 ENCOUNTER — Ambulatory Visit (INDEPENDENT_AMBULATORY_CARE_PROVIDER_SITE_OTHER): Payer: Medicaid Other | Admitting: Nurse Practitioner

## 2021-10-20 VITALS — BP 129/84 | HR 86 | Temp 98.6°F | Wt 171.6 lb

## 2021-10-20 DIAGNOSIS — R1031 Right lower quadrant pain: Secondary | ICD-10-CM

## 2021-10-20 LAB — CBC WITH DIFFERENTIAL/PLATELET
Hematocrit: 35.8 % (ref 34.0–46.6)
Hemoglobin: 12.8 g/dL (ref 11.1–15.9)
Lymphocytes Absolute: 1.4 10*3/uL (ref 0.7–3.1)
Lymphs: 20 %
MCH: 30 pg (ref 26.6–33.0)
MCHC: 35.8 g/dL — ABNORMAL HIGH (ref 31.5–35.7)
MCV: 84 fL (ref 79–97)
MID (Absolute): 0.7 10*3/uL (ref 0.1–1.6)
MID: 10 %
Neutrophils Absolute: 5.2 10*3/uL (ref 1.4–7.0)
Neutrophils: 70 %
Platelets: 266 10*3/uL (ref 150–450)
RBC: 4.27 x10E6/uL (ref 3.77–5.28)
RDW: 12.6 % (ref 11.7–15.4)
WBC: 7.3 10*3/uL (ref 3.4–10.8)

## 2021-10-20 NOTE — Progress Notes (Signed)
BP 129/84   Pulse 86   Temp 98.6 F (37 C) (Oral)   Wt 171 lb 9.6 oz (77.8 kg)   SpO2 98%   BMI 30.40 kg/m    Subjective:    Patient ID: Kelsey Aguirre, female    DOB: May 22, 2000, 21 y.o.   MRN: [id number]  HPI: Kelsey Aguirre is a 21 y.o. female  Chief Complaint  Patient presents with   Abdominal Pain    RUQ pain onset yesterday intermittent pain    Diarrhea    Onset last night. Denies recent traveling. Reports some nausea. Denies emesis    ABDOMINAL PAIN  Duration: started Onset: sudden Severity: 8/10 Quality: stabbing Location:  RUQ  Episode duration:  Radiation: no Frequency: intermittent Alleviating factors: laying down  Aggravating factors: sitting up Status: worse Treatments attempted: none Fever: no Nausea: yes Vomiting: no Weight loss: no Decreased appetite: yes Diarrhea: yes Constipation: yes Blood in stool: no Heartburn: yes Jaundice: no Rash: no Dysuria/urinary frequency: no Hematuria: no History of sexually transmitted disease: no Recurrent NSAID use: no Last ate and drank about 1 hour ago   Relevant past medical, surgical, family and social history reviewed and updated as indicated. Interim medical history since our last visit reviewed. Allergies and medications reviewed and updated.  Review of Systems  Gastrointestinal:  Positive for abdominal distention, abdominal pain, diarrhea and nausea. Negative for constipation and vomiting.    Per HPI unless specifically indicated above     Objective:    BP 129/84   Pulse 86   Temp 98.6 F (37 C) (Oral)   Wt 171 lb 9.6 oz (77.8 kg)   SpO2 98%   BMI 30.40 kg/m   Wt Readings from Last 3 Encounters:  10/20/21 171 lb 9.6 oz (77.8 kg) (92 %, Z= 1.42)*  07/12/21 159 lb 3.2 oz (72.2 kg) (87 %, Z= 1.13)*  06/16/21 164 lb (74.4 kg) (90 %, Z= 1.26)*   * Growth percentiles are based on CDC (Girls, 2-20 Years) data.    Physical Exam Vitals and nursing note reviewed.  Constitutional:       General: She is not in acute distress.    Appearance: Normal appearance. She is normal weight. She is not ill-appearing, toxic-appearing or diaphoretic.  HENT:     Head: Normocephalic.     Right Ear: External ear normal.     Left Ear: External ear normal.     Nose: Nose normal.     Mouth/Throat:     Mouth: Mucous membranes are moist.     Pharynx: Oropharynx is clear.  Eyes:     General:        Right eye: No discharge.        Left eye: No discharge.     Extraocular Movements: Extraocular movements intact.     Conjunctiva/sclera: Conjunctivae normal.     Pupils: Pupils are equal, round, and reactive to light.  Cardiovascular:     Rate and Rhythm: Normal rate and regular rhythm.     Heart sounds: No murmur heard. Pulmonary:     Effort: Pulmonary effort is normal. No respiratory distress.     Breath sounds: Normal breath sounds. No wheezing or rales.  Abdominal:     General: Bowel sounds are decreased.     Tenderness: There is abdominal tenderness in the right upper quadrant and right lower quadrant. Positive signs include McBurney's sign.  Musculoskeletal:     Cervical back: Normal range of motion and neck supple.  Skin:    General: Skin is warm and dry.     Capillary Refill: Capillary refill takes less than 2 seconds.  Neurological:     General: No focal deficit present.     Mental Status: She is alert and oriented to person, place, and time. Mental status is at baseline.  Psychiatric:        Mood and Affect: Mood normal.        Behavior: Behavior normal.        Thought Content: Thought content normal.        Judgment: Judgment normal.     Results for orders placed or performed in visit on 07/23/21  Pregnancy, urine  Result Value Ref Range   Preg Test, Ur Negative Negative      Assessment & Plan:   Problem List Items Addressed This Visit   None Visit Diagnoses     Right lower quadrant abdominal pain    -  Primary   STAT CT ordered to R/O appendicitis. Will  check CBC in house. WIll make recommendations based on lab results.    Relevant Orders   CT Abdomen Pelvis W Contrast   RLQ abdominal pain       Relevant Orders   CBC With Differential/Platelet (STAT)        Follow up plan: Return if symptoms worsen or fail to improve.

## 2021-10-20 NOTE — Telephone Encounter (Signed)
  Chief Complaint: abdominal pain Symptoms: R sided abdomen pain, stabbing pain, diarrhea x 3 today, nausea Frequency: yesterday Pertinent Negatives: Patient denies vomiting Disposition: [] ED /[] Urgent Care (no appt availability in office) / [x] Appointment(In office/virtual)/ []  [facility]/ [] Home Care/ [] Refused Recommended Disposition /[] [facility]/ []  Follow-up with PCP Additional Notes: pt said 1st stool was hard and small and then next 2 have been very loose and watery. Abdominal pain 8/10 as well. Pt was unsure if she needed to call GI or PCP. Offered appt today at 1540 and pt accepted.   Reason for Disposition  Abdominal pain  (Exception: Pain clears with each passage of diarrhea stool)  Answer Assessment - Initial Assessment Questions 1. DIARRHEA SEVERITY: "How bad is the diarrhea?" "How many more stools have you had in the past 24 hours than normal?"    - NO DIARRHEA (SCALE 0)   - MILD (SCALE 1-3): Few loose or mushy BMs; increase of 1-3 stools over normal daily number of stools; mild increase in ostomy output.   -  MODERATE (SCALE 4-7): Increase of 4-6 stools daily over normal; moderate increase in ostomy output. * SEVERE (SCALE 8-10; OR 'WORST POSSIBLE'): Increase of 7 or more stools daily over normal; moderate increase in ostomy output; incontinence.     2  2. ONSET: "When did the diarrhea begin?"      Yesterday afternoon 3. BM CONSISTENCY: "How loose or watery is the diarrhea?"      Loose and watery  4. VOMITING: "Are you also vomiting?" If Yes, ask: "How many times in the past 24 hours?"      no 5. ABDOMINAL PAIN: "Are you having any abdominal pain?" If Yes, ask: "What does it feel like?" (e.g., crampy, dull, intermittent, constant)      R sided pain, stabbing pain  6. ABDOMINAL PAIN SEVERITY: If present, ask: "How bad is the pain?"  (e.g., Scale 1-10; mild, moderate, or severe)   - MILD (1-3): doesn't interfere with normal activities, abdomen soft  and not tender to touch    - MODERATE (4-7): interferes with normal activities or awakens from sleep, abdomen tender to touch    - SEVERE (8-10): excruciating pain, doubled over, unable to do any normal activities       8 8. HYDRATION: "Any signs of dehydration?" (e.g., dry mouth [not just dry lips], too weak to stand, dizziness, new weight loss) "When did you last urinate?"     no 11. OTHER SYMPTOMS: "Do you have any other symptoms?" (e.g., fever, blood in stool)       nausea  Protocols used: Diarrhea-A-AH

## 2021-10-21 ENCOUNTER — Ambulatory Visit
Admission: RE | Admit: 2021-10-21 | Discharge: 2021-10-21 | Disposition: A | Discharge status: Home / Self Care | Payer: Medicaid Other | Source: Ambulatory Visit | Attending: Nurse Practitioner | Admitting: Nurse Practitioner

## 2021-10-21 ENCOUNTER — Telehealth: Payer: Self-pay | Admitting: *Deleted

## 2021-10-21 DIAGNOSIS — R1031 Right lower quadrant pain: Secondary | ICD-10-CM | POA: Insufficient documentation

## 2021-10-21 MED ORDER — IOHEXOL 300 MG/ML  SOLN
100.0000 mL | Freq: Once | INTRAMUSCULAR | Status: AC | PRN
  Administered 2021-10-21: 100 mL via INTRAVENOUS

## 2021-10-21 NOTE — Progress Notes (Signed)
HI Atley.  Your lab work looks good.  We will see what your CT says today.  Please let me know if you have any questions.

## 2021-10-21 NOTE — Telephone Encounter (Signed)
CT stat- results IMPRESSION:  1. No acute findings in the abdomen or pelvis.  2. Appendix appears normal.  3. Punctate nonobstructing stone of the upper pole the right kidney.   Advised they are holding patient Call to office

## 2021-10-21 NOTE — Progress Notes (Signed)
Spoke with patient on the phone and results were discussed.  Increase fluid intake. Follow up in on Monday if symptoms not improved.

## 2021-10-25 ENCOUNTER — Encounter: Payer: Self-pay | Admitting: Nurse Practitioner

## 2021-11-03 ENCOUNTER — Encounter: Payer: Self-pay | Admitting: Unknown Physician Specialty

## 2021-11-03 ENCOUNTER — Ambulatory Visit: Payer: Self-pay | Admitting: *Deleted

## 2021-11-03 ENCOUNTER — Telehealth (INDEPENDENT_AMBULATORY_CARE_PROVIDER_SITE_OTHER): Payer: Medicaid Other | Admitting: Unknown Physician Specialty

## 2021-11-03 DIAGNOSIS — U071 COVID-19: Secondary | ICD-10-CM | POA: Diagnosis not present

## 2021-11-03 MED ORDER — NIRMATRELVIR/RITONAVIR (PAXLOVID)TABLET: 3.0000 | ORAL_TABLET | Freq: Two times a day (BID) | ORAL | 0 refills | Status: AC

## 2021-11-03 NOTE — Progress Notes (Addendum)
LMP  (LMP Unknown) Comment: Patient states she is on depoprovera and does not have periods   Subjective:    Patient ID: Kelsey Aguirre, female    DOB: 07-09-2000, 21 y.o.   MRN: [id number]  HPI: Kelsey Aguirre is a 21 y.o. female  Chief Complaint  Patient presents with   Covid Positive    Tested positive this morning and came up positive, symptoms started last night. Symptoms are: sore throat, runny nose, headache, cough and a little SOB   Due to the catastrophic nature of the COVID-19 pandemic, this visit was completed via audio and visual contact via [employer] due to the restrictions of the COVID-19 pandemic. All issues as above were discussed and addressed. Physical exam was done as above through visual confirmation on [employer]. If it was felt that the patient should be evaluated in the office, they were directed there. The patient verbally consented to this visit. Location of the patient: home Location of the provider: work Those involved with this call:  Provider: Gabriel Cirri, DNP Time spent on call:  10 minutes on the phone discussing health concerns. 10 minutes total spent in review of patient's record and preparation of their chart. I verified patient identity using two factors (patient name and date of birth). Patient consents verbally to being seen via telemedicine visit today.   URI  This is a new problem. The current episode started yesterday. The problem has been unchanged. There has been no fever. Associated symptoms include congestion, coughing, headaches, rhinorrhea, sinus pain and sneezing. Pertinent negatives include no abdominal pain, chest pain, diarrhea, dysuria, ear pain, joint pain, joint swelling, nausea, neck pain, plugged ear sensation, rash, sore throat, swollen glands, vomiting or wheezing. She has tried nothing for the symptoms. The treatment provided no relief.    Relevant past medical, surgical, family and social history reviewed and updated as  indicated. Interim medical history since our last visit reviewed. Allergies and medications reviewed and updated.  Review of Systems  HENT:  Positive for congestion, rhinorrhea, sinus pain and sneezing. Negative for ear pain and sore throat.   Respiratory:  Positive for cough. Negative for wheezing.   Cardiovascular:  Negative for chest pain.  Gastrointestinal:  Negative for abdominal pain, diarrhea, nausea and vomiting.  Genitourinary:  Negative for dysuria.  Musculoskeletal:  Negative for joint pain and neck pain.  Skin:  Negative for rash.  Neurological:  Positive for headaches.    Per HPI unless specifically indicated above     Objective:    LMP  (LMP Unknown) Comment: Patient states she is on depoprovera and does not have periods  Wt Readings from Last 3 Encounters:  10/20/21 171 lb 9.6 oz (77.8 kg) (92 %, Z= 1.42)*  07/12/21 159 lb 3.2 oz (72.2 kg) (87 %, Z= 1.13)*  06/16/21 164 lb (74.4 kg) (90 %, Z= 1.26)*   * Growth percentiles are based on CDC (Girls, 2-20 Years) data.    Physical Exam Constitutional:      General: She is not in acute distress.    Appearance: Normal appearance. She is well-developed.  HENT:     Head: Normocephalic and atraumatic.  Eyes:     General: Lids are normal. No scleral icterus.       Right eye: No discharge.        Left eye: No discharge.     Conjunctiva/sclera: Conjunctivae normal.  Cardiovascular:     Rate and Rhythm: Normal rate.  Pulmonary:  Effort: Pulmonary effort is normal.  Abdominal:     Palpations: There is no hepatomegaly or splenomegaly.  Musculoskeletal:        General: Normal range of motion.  Skin:    Coloration: Skin is not pale.     Findings: No rash.  Neurological:     Mental Status: She is alert and oriented to person, place, and time.  Psychiatric:        Behavior: Behavior normal.        Thought Content: Thought content normal.        Judgment: Judgment normal.     Results for orders placed or  performed in visit on 10/20/21  CBC With Differential/Platelet (STAT)  Result Value Ref Range   WBC 7.3 3.4 - 10.8 x10E3/uL   RBC 4.27 3.77 - 5.28 x10E6/uL   Hemoglobin 12.8 11.1 - 15.9 g/dL   Hematocrit 89.2 11.9 - 46.6 %   MCV 84 79 - 97 fL   MCH 30.0 26.6 - 33.0 pg   MCHC 35.8 (H) 31.5 - 35.7 g/dL   RDW 41.7 40.8 - 14.4 %   Platelets 266 150 - 450 x10E3/uL   Neutrophils 70 Not Estab. %   Lymphs 20 Not Estab. %   MID 10 Not Estab. %   Neutrophils Absolute 5.2 1.4 - 7.0 x10E3/uL   Lymphocytes Absolute 1.4 0.7 - 3.1 x10E3/uL   MID (Absolute) 0.7 0.1 - 1.6 X10E3/uL      Assessment & Plan:   Problem List Items Addressed This Visit   None Visit Diagnoses     COVID    -  Primary   Positive home test and symptomatic x 1 day.  OK for Paxlovid with obesity and asthma.  Encouraged to take symbicort she has and prn albuterol.  Saline nasal spr   Relevant Medications   nirmatrelvir/ritonavir EUA (PAXLOVID) 20 x 150 MG & 10 x 100MG  TABS        Follow up plan: Return if symptoms worsen or fail to improve.

## 2021-11-03 NOTE — Telephone Encounter (Signed)
  Chief Complaint: positive covid today requesting antiviral hx asthma Symptoms: headache, runny nose, sore throat, dry cough, shortness of breath with exertion Frequency: started yesterday  Pertinent Negatives: Patient denies fever Disposition: [] ED /[] Urgent Care (no appt availability in office) / [x] Appointment(In office/virtual)/ []  [place] [facility]/ [] Home Care/ [] Refused Recommended Disposition /[] [facility]/ []  Follow-up with PCP Additional Notes:   My Chart VV today    Reason for Disposition  [1] HIGH RISK patient (e.g., weak immune system, age > 64 years, obesity with BMI 30 or higher, pregnant, chronic lung disease or other chronic medical condition) AND [2] COVID symptoms (e.g., cough, fever)  (Exceptions: Already seen by PCP and no new or worsening symptoms.)  Answer Assessment - Initial Assessment Questions 1. COVID-19 DIAGNOSIS: "How do you know that you have COVID?" (e.g., positive lab test or self-test, diagnosed by doctor or NP/PA, symptoms after exposure).     Positive at home covid test today  2. COVID-19 EXPOSURE: "Was there any known exposure to COVID before the symptoms began?" CDC Definition of close contact: within 6 feet (2 meters) for a total of 15 minutes or more over a 24-hour period.      Father positive Monday  3. ONSET: "When did the COVID-19 symptoms start?"      Yesterday  4. WORST SYMPTOM: "What is your worst symptom?" (e.g., cough, fever, shortness of breath, muscle aches)     Shortness of breath with exertion, dry cough, HA, sore throat, runny nose  5. COUGH: "Do you have a cough?" If Yes, ask: "How bad is the cough?"       Dry cough  6. FEVER: "Do you have a fever?" If Yes, ask: "What is your temperature, how was it measured, and when did it start?"     no 7. RESPIRATORY STATUS: "Describe your breathing?" (e.g., normal; shortness of breath, wheezing, unable to speak)      SOB 8. BETTER-SAME-WORSE: "Are you getting better, staying the  same or getting worse compared to yesterday?"  If getting worse, ask, "In what way?"     Worse headache and could not sleep 9. OTHER SYMPTOMS: "Do you have any other symptoms?"  (e.g., chills, fatigue, headache, loss of smell or taste, muscle pain, sore throat)     See above  10. HIGH RISK DISEASE: "Do you have any chronic medical problems?" (e.g., asthma, heart or lung disease, weak immune system, obesity, etc.)       Asthma  11. VACCINE: "Have you had the COVID-19 vaccine?" If Yes, ask: "Which one, how many shots, when did you get it?"       Yes Pfizer  12. PREGNANCY: "Is there any chance you are pregnant?" "When was your last menstrual period?"       na 13. O2 SATURATION MONITOR:  "Do you use an oxygen saturation monitor (pulse oximeter) at home?" If Yes, ask "What is your reading (oxygen level) today?" "What is your usual oxygen saturation reading?" (e.g., 95%)       na  Protocols used: Coronavirus (COVID-19) Diagnosed or Suspected-A-AH

## 2021-12-15 ENCOUNTER — Encounter: Payer: Medicaid Other | Admitting: Nurse Practitioner

## 2021-12-30 ENCOUNTER — Ambulatory Visit: Payer: Medicaid Other

## 2021-12-30 DIAGNOSIS — Z309 Encounter for contraceptive management, unspecified: Secondary | ICD-10-CM

## 2021-12-30 NOTE — Progress Notes (Signed)
Patient presents today within her window for Depro-Provera injection patient received in Left upper, outer gluteal  patient tolerated well.  Patient was given time frame for next injection.

## 2022-01-10 NOTE — Progress Notes (Signed)
BP 124/77   Pulse 85   Temp 98.5 F (36.9 C) (Oral)   Ht 5' 4.57" (1.64 m)   Wt 172 lb 3.2 oz (78.1 kg)   SpO2 96%   BMI 29.04 kg/m    Subjective:    Patient ID: Kelsey Aguirre, female    DOB: [birth year], 21 y.o.   MRN: [id number]  HPI: Kelsey Aguirre is a 21 y.o. female presenting on 01/11/2022 for comprehensive medical examination. Current medical complaints include:none  She currently lives with: Menopausal Symptoms: no  MOOD Patient states she is still on Zyprexa and Lithium.  She just saw her psychiatrist on Friday and things are going well.  Denies concerns at visit today.    Depression Screen done today and results listed below:     01/11/2022    9:52 AM 11/03/2021    9:31 AM 10/20/2021    3:49 PM 07/12/2021    9:45 AM 06/14/2021    9:59 AM  Depression screen PHQ 2/9  Decreased Interest 0 0 0 0 0  Down, Depressed, Hopeless 0 0 0 0 0  PHQ - 2 Score 0 0 0 0 0  Altered sleeping 0 1 2 3 1   Tired, decreased energy 0 0 2 0 0  Change in appetite 2 1 2 3  0  Feeling bad or failure about yourself  0 0 0 0 0  Trouble concentrating 0 0 0 0 2  Moving slowly or fidgety/restless 0 0 0 0 0  Suicidal thoughts 0 0 0 0 0  PHQ-9 Score 2 2 6 6 3   Difficult doing work/chores Not difficult at all Not difficult at all Somewhat difficult Somewhat difficult Somewhat difficult    The patient does not have a history of falls. I did complete a risk assessment for falls. A plan of care for falls was documented.   Past Medical History:  Past Medical History:  Diagnosis Date   Allergy    Anxiety    Asthma    Bipolar 1 disorder (HCC)    Depression    Scoliosis     Surgical History:  Past Surgical History:  Procedure Laterality Date   TONSILLECTOMY      Medications:  Current Outpatient Medications on File Prior to Visit  Medication Sig   albuterol (VENTOLIN HFA) 108 (90 Base) MCG/ACT inhaler Inhale 2 puffs into the lungs every 6 (six) hours as needed for wheezing or  shortness of breath.   budesonide-formoterol (SYMBICORT) 160-4.5 MCG/ACT inhaler INHALE 2 PUFFS INTO THE LUNGS TWICE DAILY   dicyclomine (BENTYL) 10 MG capsule Take 1 capsule (10 mg total) by mouth 4 (four) times daily -  before meals and at bedtime.   famotidine (PEPCID) 20 MG tablet Take 1 tablet (20 mg total) by mouth 2 (two) times daily.   fluticasone (FLONASE) 50 MCG/ACT nasal spray SHAKE LIQUID AND USE 1 SPRAY IN EACH NOSTRIL TWICE DAILY   lithium carbonate 150 MG capsule Take 1 capsule by mouth in the morning and at bedtime.   lithium carbonate 300 MG capsule Take 300 mg by mouth 2 (two) times daily.   OLANZapine (ZYPREXA) 10 MG tablet Take 10 mg by mouth at bedtime.   rizatriptan (MAXALT) 10 MG tablet Take 10 mg by mouth 2 (two) times daily as needed.   Current Facility-Administered Medications on File Prior to Visit  Medication   medroxyPROGESTERone (DEPO-PROVERA) injection 150 mg    Allergies:  Allergies  Allergen Reactions   Zyrtec [Cetirizine] Nausea And  Vomiting    Social History:  Social History   Socioeconomic History   Marital status: Single    Spouse name: Not on file   Number of children: Not on file   Years of education: Not on file   Highest education level: Not on file  Occupational History   Not on file  Tobacco Use   Smoking status: Never   Smokeless tobacco: Never  Vaping Use   Vaping Use: Never used  Substance and Sexual Activity   Alcohol use: No   Drug use: No   Sexual activity: Yes    Birth control/protection: Injection  Other Topics Concern   Not on file  Social History Narrative   Not on file   Social Determinants of Health   Financial Resource Strain: Not on file  Food Insecurity: Not on file  Transportation Needs: Not on file  Physical Activity: Not on file  Stress: Not on file  Social Connections: Not on file  Intimate Partner Violence: Not on file   Social History   Tobacco Use  Smoking Status Never  Smokeless Tobacco  Never   Social History   Substance and Sexual Activity  Alcohol Use No    Family History:  Family History  Problem Relation Age of Onset   Drug abuse Mother    Hepatitis C Mother    Hypertension Mother    Hepatitis B Father    HIV Father    Cancer Maternal Grandmother    Hypertension Maternal Grandmother    Osteoporosis Maternal Grandmother    Heart disease Maternal Grandfather    Lung disease Paternal Grandmother     Past medical history, surgical history, medications, allergies, family history and social history reviewed with patient today and changes made to appropriate areas of the chart.   Review of Systems  Eyes:  Negative for blurred vision and double vision.  Respiratory:  Negative for shortness of breath.   Cardiovascular:  Negative for chest pain, palpitations and leg swelling.  Neurological:  Negative for dizziness and headaches.  Psychiatric/Behavioral:  Negative for depression and suicidal ideas. The patient is not nervous/anxious.    All other ROS negative except what is listed above and in the HPI.      Objective:    BP 124/77   Pulse 85   Temp 98.5 F (36.9 C) (Oral)   Ht 5' 4.57" (1.64 m)   Wt 172 lb 3.2 oz (78.1 kg)   SpO2 96%   BMI 29.04 kg/m   Wt Readings from Last 3 Encounters:  01/11/22 172 lb 3.2 oz (78.1 kg)  10/20/21 171 lb 9.6 oz (77.8 kg) (92 %, Z= 1.42)*  07/12/21 159 lb 3.2 oz (72.2 kg) (87 %, Z= 1.13)*   * Growth percentiles are based on CDC (Girls, 2-20 Years) data.    Physical Exam Vitals and nursing note reviewed.  Constitutional:      General: She is awake. She is not in acute distress.    Appearance: Normal appearance. She is well-developed and normal weight. She is not ill-appearing.  HENT:     Head: Normocephalic and atraumatic.     Right Ear: Hearing, tympanic membrane, ear canal and external ear normal. No drainage.     Left Ear: Hearing, tympanic membrane, ear canal and external ear normal. No drainage.     Nose:  Nose normal.     Right Sinus: No maxillary sinus tenderness or frontal sinus tenderness.     Left Sinus: No maxillary sinus tenderness  or frontal sinus tenderness.     Mouth/Throat:     Mouth: Mucous membranes are moist.     Pharynx: Oropharynx is clear. Uvula midline. No pharyngeal swelling, oropharyngeal exudate or posterior oropharyngeal erythema.  Eyes:     General: Lids are normal.        Right eye: No discharge.        Left eye: No discharge.     Extraocular Movements: Extraocular movements intact.     Conjunctiva/sclera: Conjunctivae normal.     Pupils: Pupils are equal, round, and reactive to light.     Visual Fields: Right eye visual fields normal and left eye visual fields normal.  Neck:     Thyroid: No thyromegaly.     Vascular: No carotid bruit.     Trachea: Trachea normal.  Cardiovascular:     Rate and Rhythm: Normal rate and regular rhythm.     Heart sounds: Normal heart sounds. No murmur heard.    No gallop.  Pulmonary:     Effort: Pulmonary effort is normal. No accessory muscle usage or respiratory distress.     Breath sounds: Normal breath sounds.  Chest:  Breasts:    Right: Normal.     Left: Normal.  Abdominal:     General: Bowel sounds are normal.     Palpations: Abdomen is soft. There is no hepatomegaly or splenomegaly.     Tenderness: There is no abdominal tenderness.  Musculoskeletal:        General: Normal range of motion.     Cervical back: Normal range of motion and neck supple.     Right lower leg: No edema.     Left lower leg: No edema.  Lymphadenopathy:     Head:     Right side of head: No submental, submandibular, tonsillar, preauricular or posterior auricular adenopathy.     Left side of head: No submental, submandibular, tonsillar, preauricular or posterior auricular adenopathy.     Cervical: No cervical adenopathy.     Upper Body:     Right upper body: No supraclavicular, axillary or pectoral adenopathy.     Left upper body: No  supraclavicular, axillary or pectoral adenopathy.  Skin:    General: Skin is warm and dry.     Capillary Refill: Capillary refill takes less than 2 seconds.     Findings: No rash.  Neurological:     Mental Status: She is alert and oriented to person, place, and time.     Gait: Gait is intact.     Deep Tendon Reflexes: Reflexes are normal and symmetric.     Reflex Scores:      Brachioradialis reflexes are 2+ on the right side and 2+ on the left side.      Patellar reflexes are 2+ on the right side and 2+ on the left side. Psychiatric:        Attention and Perception: Attention normal.        Mood and Affect: Mood normal.        Speech: Speech normal.        Behavior: Behavior normal. Behavior is cooperative.        Thought Content: Thought content normal.        Judgment: Judgment normal.     Results for orders placed or performed in visit on 10/20/21  CBC With Differential/Platelet (STAT)  Result Value Ref Range   WBC 7.3 3.4 - 10.8 x10E3/uL   RBC 4.27 3.77 - 5.28 x10E6/uL   Hemoglobin  12.8 11.1 - 15.9 g/dL   Hematocrit 78.5 88.5 - 46.6 %   MCV 84 79 - 97 fL   MCH 30.0 26.6 - 33.0 pg   MCHC 35.8 (H) 31.5 - 35.7 g/dL   RDW 02.7 74.1 - 28.7 %   Platelets 266 150 - 450 x10E3/uL   Neutrophils 70 Not Estab. %   Lymphs 20 Not Estab. %   MID 10 Not Estab. %   Neutrophils Absolute 5.2 1.4 - 7.0 x10E3/uL   Lymphocytes Absolute 1.4 0.7 - 3.1 x10E3/uL   MID (Absolute) 0.7 0.1 - 1.6 X10E3/uL      Assessment & Plan:   Problem List Items Addressed This Visit       Respiratory   Moderate persistent asthma with exacerbation    Chronic.  Controlled.  Continue with current medication regimen.  Labs ordered today.  Return to clinic in 6 months for reevaluation.  Call sooner if concerns arise.          Other   Anxiety    Chronic.  Controlled.  Continue with current medication regimen on Lithium and Zyrexa.   Followed by Psychiatry.  Labs ordered today.  Return to clinic in 6  months for reevaluation.  Call sooner if concerns arise.        Depression    Chronic.  Controlled.  Continue with current medication regimen on Lithium and Zyrexa.   Followed by Psychiatry.  Labs ordered today.  Return to clinic in 6 months for reevaluation.  Call sooner if concerns arise.       Vitamin D deficiency    Labs ordered at visit today.  Will make recommendations based on lab results.        Bipolar 1 disorder (HCC)    Chronic.  Controlled.  Continue with current medication regimen on Lithium and Zyrexa.   Followed by Psychiatry.  Labs ordered today.  Return to clinic in 6 months for reevaluation.  Call sooner if concerns arise.       Other Visit Diagnoses     Annual physical exam    -  Primary   Relevant Orders   CBC with Differential/Platelet   Comprehensive metabolic panel   Lipid panel   TSH   Urinalysis, Routine w reflex microscopic   Screening for chlamydial disease       Relevant Orders   Chlamydia/Gonococcus/Trichomonas, NAA([facility])   Need for tetanus booster       Relevant Orders   Tdap vaccine greater than or equal to 7yo IM   Need for Tdap vaccination       Relevant Orders   Flu Vaccine QUAD 6+ mos PF IM (Fluarix Quad PF)        Follow up plan: Return in about 6 months (around 07/13/2022) for Depression/Anxiety FU.   LABORATORY TESTING:  - Pap smear: not applicable  IMMUNIZATIONS:   - Tdap: Tetanus vaccination status reviewed: last tetanus booster within 10 years. - Influenza: Administered today - Pneumovax: Not applicable - Prevnar: Not applicable - COVID: Not applicable - HPV: up to date - Shingrix vaccine: Not applicable  SCREENING: -Mammogram: Not applicable  - Colonoscopy: Not applicable  - Bone Density: Not applicable  -Hearing Test: Not applicable  -Spirometry: Not applicable   PATIENT COUNSELING:   Advised to take 1 mg of folate supplement per day if capable of pregnancy.   Sexuality: Discussed sexually transmitted  diseases, partner selection, use of condoms, avoidance of unintended pregnancy  and contraceptive alternatives.  Advised to avoid cigarette smoking.  I discussed with the patient that most people either abstain from alcohol or drink within safe limits (<=14/week and <=4 drinks/occasion for males, <=7/weeks and <= 3 drinks/occasion for females) and that the risk for alcohol disorders and other health effects rises proportionally with the number of drinks per week and how often a drinker exceeds daily limits.  Discussed cessation/primary prevention of drug use and availability of treatment for abuse.   Diet: Encouraged to adjust caloric intake to maintain  or achieve ideal body weight, to reduce intake of dietary saturated fat and total fat, to limit sodium intake by avoiding high sodium foods and not adding table salt, and to maintain adequate dietary potassium and calcium preferably from fresh fruits, vegetables, and low-fat dairy products.    stressed the importance of regular exercise  Injury prevention: Discussed safety belts, safety helmets, smoke detector, smoking near bedding or upholstery.   Dental health: Discussed importance of regular tooth brushing, flossing, and dental visits.    NEXT PREVENTATIVE PHYSICAL DUE IN 1 YEAR. Return in about 6 months (around 07/13/2022) for Depression/Anxiety FU.

## 2022-01-11 ENCOUNTER — Encounter: Payer: Self-pay | Admitting: Nurse Practitioner

## 2022-01-11 ENCOUNTER — Ambulatory Visit (INDEPENDENT_AMBULATORY_CARE_PROVIDER_SITE_OTHER): Payer: Medicaid Other | Admitting: Nurse Practitioner

## 2022-01-11 VITALS — BP 124/77 | HR 85 | Temp 98.5°F | Ht 64.57 in | Wt 172.2 lb

## 2022-01-11 DIAGNOSIS — Z23 Encounter for immunization: Secondary | ICD-10-CM | POA: Diagnosis not present

## 2022-01-11 DIAGNOSIS — F419 Anxiety disorder, unspecified: Secondary | ICD-10-CM | POA: Diagnosis not present

## 2022-01-11 DIAGNOSIS — F319 Bipolar disorder, unspecified: Secondary | ICD-10-CM

## 2022-01-11 DIAGNOSIS — J4541 Moderate persistent asthma with (acute) exacerbation: Secondary | ICD-10-CM

## 2022-01-11 DIAGNOSIS — E559 Vitamin D deficiency, unspecified: Secondary | ICD-10-CM

## 2022-01-11 DIAGNOSIS — Z0000 Encounter for general adult medical examination without abnormal findings: Secondary | ICD-10-CM | POA: Diagnosis not present

## 2022-01-11 DIAGNOSIS — F3341 Major depressive disorder, recurrent, in partial remission: Secondary | ICD-10-CM | POA: Diagnosis not present

## 2022-01-11 DIAGNOSIS — Z118 Encounter for screening for other infectious and parasitic diseases: Secondary | ICD-10-CM

## 2022-01-11 LAB — URINALYSIS, ROUTINE W REFLEX MICROSCOPIC
Bilirubin, UA: NEGATIVE
Glucose, UA: NEGATIVE
Ketones, UA: NEGATIVE
Nitrite, UA: NEGATIVE
Protein,UA: NEGATIVE
RBC, UA: NEGATIVE
Specific Gravity, UA: 1.02 (ref 1.005–1.030)
Urobilinogen, Ur: 0.2 mg/dL (ref 0.2–1.0)
pH, UA: 7.5 (ref 5.0–7.5)

## 2022-01-11 LAB — MICROSCOPIC EXAMINATION

## 2022-01-11 NOTE — Assessment & Plan Note (Signed)
Chronic.  Controlled.  Continue with current medication regimen on Lithium and Zyrexa.   Followed by Psychiatry.  Labs ordered today.  Return to clinic in 6 months for reevaluation.  Call sooner if concerns arise.   

## 2022-01-11 NOTE — Assessment & Plan Note (Signed)
Labs ordered at visit today.  Will make recommendations based on lab results.   

## 2022-01-11 NOTE — Assessment & Plan Note (Signed)
Chronic.  Controlled.  Continue with current medication regimen.  Labs ordered today.  Return to clinic in 6 months for reevaluation.  Call sooner if concerns arise.  ? ?

## 2022-01-11 NOTE — Assessment & Plan Note (Signed)
Chronic.  Controlled.  Continue with current medication regimen on Lithium and Zyrexa.   Followed by Psychiatry.  Labs ordered today.  Return to clinic in 6 months for reevaluation.  Call sooner if concerns arise.

## 2022-01-12 LAB — CBC WITH DIFFERENTIAL/PLATELET
Basophils Absolute: 0 10*3/uL (ref 0.0–0.2)
Basos: 0 %
EOS (ABSOLUTE): 0.2 10*3/uL (ref 0.0–0.4)
Eos: 2 %
Hematocrit: 38.1 % (ref 34.0–46.6)
Hemoglobin: 13.2 g/dL (ref 11.1–15.9)
Immature Grans (Abs): 0 10*3/uL (ref 0.0–0.1)
Immature Granulocytes: 0 %
Lymphocytes Absolute: 1.7 10*3/uL (ref 0.7–3.1)
Lymphs: 24 %
MCH: 30.7 pg (ref 26.6–33.0)
MCHC: 34.6 g/dL (ref 31.5–35.7)
MCV: 89 fL (ref 79–97)
Monocytes Absolute: 0.4 10*3/uL (ref 0.1–0.9)
Monocytes: 6 %
Neutrophils Absolute: 4.7 10*3/uL (ref 1.4–7.0)
Neutrophils: 68 %
Platelets: 270 10*3/uL (ref 150–450)
RBC: 4.3 x10E6/uL (ref 3.77–5.28)
RDW: 12.4 % (ref 11.7–15.4)
WBC: 7 10*3/uL (ref 3.4–10.8)

## 2022-01-12 LAB — COMPREHENSIVE METABOLIC PANEL
ALT: 13 IU/L (ref 0–32)
AST: 13 IU/L (ref 0–40)
Albumin/Globulin Ratio: 2.3 — ABNORMAL HIGH (ref 1.2–2.2)
Albumin: 4.8 g/dL (ref 4.0–5.0)
Alkaline Phosphatase: 97 IU/L (ref 42–106)
BUN/Creatinine Ratio: 11 (ref 9–23)
BUN: 10 mg/dL (ref 6–20)
Bilirubin Total: 0.4 mg/dL (ref 0.0–1.2)
CO2: 18 mmol/L — ABNORMAL LOW (ref 20–29)
Calcium: 9.9 mg/dL (ref 8.7–10.2)
Chloride: 107 mmol/L — ABNORMAL HIGH (ref 96–106)
Creatinine, Ser: 0.88 mg/dL (ref 0.57–1.00)
Globulin, Total: 2.1 g/dL (ref 1.5–4.5)
Glucose: 87 mg/dL (ref 70–99)
Potassium: 4.4 mmol/L (ref 3.5–5.2)
Sodium: 139 mmol/L (ref 134–144)
Total Protein: 6.9 g/dL (ref 6.0–8.5)
eGFR: 96 mL/min/{1.73_m2} (ref >59)

## 2022-01-12 LAB — TSH: TSH: 3.06 u[IU]/mL (ref 0.450–4.500)

## 2022-01-12 LAB — LIPID PANEL
Chol/HDL Ratio: 4.5 ratio — ABNORMAL HIGH (ref 0.0–4.4)
Cholesterol, Total: 150 mg/dL (ref 100–199)
HDL: 33 mg/dL — ABNORMAL LOW (ref >39)
LDL Chol Calc (NIH): 96 mg/dL (ref 0–99)
Triglycerides: 117 mg/dL (ref 0–149)
VLDL Cholesterol Cal: 21 mg/dL (ref 5–40)

## 2022-01-12 NOTE — Progress Notes (Signed)
Hi Kelsey Aguirre. It was nice to see you yesterday.  Your lab work looks good.  No concerns at this time. Continue with your current medication regimen.  Follow up as discussed.  Please let me know if you have any questions.

## 2022-01-13 LAB — CHLAMYDIA/GONOCOCCUS/TRICHOMONAS, NAA
Chlamydia by NAA: NEGATIVE
Gonococcus by NAA: NEGATIVE
Trich vag by NAA: NEGATIVE

## 2022-02-27 ENCOUNTER — Other Ambulatory Visit: Payer: Self-pay | Admitting: Gastroenterology

## 2022-02-28 ENCOUNTER — Other Ambulatory Visit: Payer: Self-pay | Admitting: Gastroenterology

## 2022-03-05 ENCOUNTER — Other Ambulatory Visit: Payer: Self-pay | Admitting: Nurse Practitioner

## 2022-03-07 ENCOUNTER — Encounter: Payer: Self-pay | Admitting: Nurse Practitioner

## 2022-03-07 MED ORDER — BUDESONIDE-FORMOTEROL FUMARATE 160-4.5 MCG/ACT IN AERO: 2.0000 | INHALATION_SPRAY | Freq: Two times a day (BID) | RESPIRATORY_TRACT | 5 refills | Status: DC

## 2022-03-08 NOTE — Telephone Encounter (Signed)
Unable to refill per protocol, last refill by provider 03/07/22.E-Prescribing Status: Sent to pharmacy (03/07/2022  9:38 AM EST). Will refuse.  Requested Prescriptions  Pending Prescriptions Disp Refills   SYMBICORT 160-4.5 MCG/ACT inhaler [Pharmacy Med Name: SYMBICORT 160/4. (120 ORAL INH)] 10.2 g 5    Sig: INHALE 2 PUFFS INTO THE LUNGS TWICE DAILY     Pulmonology:  Combination Products Passed - 03/05/2022 11:48 AM      Passed - Valid encounter within last 12 months    Recent Outpatient Visits           1 month ago Annual physical exam   [facility] Larae Grooms, NP   4 months ago COVID   [facility] [place], [place], NP   4 months ago Right lower quadrant abdominal pain   [facility] Larae Grooms, NP   7 months ago Encounter for contraceptive management, unspecified type   [facility] Larae Grooms, NP   8 months ago Bipolar 1 disorder [facility])   [facility] Larae Grooms, NP       Future Appointments             In 4 months Larae Grooms, NP [facility]

## 2022-03-25 ENCOUNTER — Ambulatory Visit: Payer: Medicaid Other

## 2022-03-29 ENCOUNTER — Ambulatory Visit (INDEPENDENT_AMBULATORY_CARE_PROVIDER_SITE_OTHER): Payer: Medicaid Other

## 2022-03-29 DIAGNOSIS — Z3042 Encounter for surveillance of injectable contraceptive: Secondary | ICD-10-CM | POA: Diagnosis not present

## 2022-03-29 DIAGNOSIS — Z309 Encounter for contraceptive management, unspecified: Secondary | ICD-10-CM | POA: Diagnosis not present

## 2022-03-29 NOTE — Progress Notes (Signed)
Patient presents today for Depo-Provera injection, patient received in left upper outer quad  , patient tolerated well.  Patient was given new time frame from 06/15/22 thru 06/29/22 for next injection

## 2022-05-04 ENCOUNTER — Other Ambulatory Visit: Payer: Self-pay | Admitting: Nurse Practitioner

## 2022-05-05 NOTE — Telephone Encounter (Signed)
Requested medication (s) are due for refill today: expired medication  Requested medication (s) are on the active medication list: yes  Last refill:  06/15/20 #48g 6 refills  Future visit scheduled: yes in 2 months  Notes to clinic:  expired medication . Do you want to renew Rx?     Requested Prescriptions  Pending Prescriptions Disp Refills   fluticasone (FLONASE) 50 MCG/ACT nasal spray [Pharmacy Med Name: FLUTICASONE 50MCG NASAL SP (120) RX] 48 g 6    Sig: SHAKE LIQUID AND USE 1 SPRAY IN EACH NOSTRIL TWICE DAILY     Ear, Nose, and Throat: Nasal Preparations - Corticosteroids Passed - 05/04/2022  5:04 PM      Passed - Valid encounter within last 12 months    Recent Outpatient Visits           3 months ago Annual physical exam   [place], NP   6 months ago [place] Kathrine Haddock, NP   6 months ago Right lower quadrant abdominal pain   [place], NP   9 months ago Encounter for contraceptive management, unspecified type   [place], NP   10 months ago Bipolar 1 disorder [facility])   [place] Jon Billings, NP       Future Appointments             In 2 months Jon Billings, NP [place], [place]

## 2022-05-10 ENCOUNTER — Telehealth: Payer: Medicaid Other | Admitting: Physician Assistant

## 2022-05-10 ENCOUNTER — Ambulatory Visit: Payer: Self-pay | Admitting: *Deleted

## 2022-05-10 ENCOUNTER — Encounter: Payer: Self-pay | Admitting: Physician Assistant

## 2022-05-10 DIAGNOSIS — J4541 Moderate persistent asthma with (acute) exacerbation: Secondary | ICD-10-CM

## 2022-05-10 MED ORDER — AZITHROMYCIN 250 MG PO TABS: ORAL_TABLET | ORAL | 0 refills | Status: AC

## 2022-05-10 MED ORDER — BENZONATATE 100 MG PO CAPS: 100.0000 mg | ORAL_CAPSULE | Freq: Three times a day (TID) | ORAL | 0 refills | Status: DC | PRN

## 2022-05-10 MED ORDER — PREDNISONE 20 MG PO TABS
40.0000 mg | ORAL_TABLET | Freq: Every day | ORAL | 0 refills | Status: DC
Start: 2022-05-10 — End: 2022-09-22

## 2022-05-10 NOTE — Progress Notes (Signed)
Erroneous encounter. Disregard.

## 2022-05-10 NOTE — Patient Instructions (Signed)
Kelsey Aguirre, thank you for joining Leeanne Rio, PA-C for today's virtual visit.  While this provider is not your primary care provider (PCP), if your PCP is located in our provider database this encounter information will be shared with them immediately following your visit.   [facility] account gives you access to today's visit and all your visits, tests, and labs performed at [facility] " click here if you don't have a [facility] account or go to [email or web address]  Consent: (Patient) Kelsey Aguirre provided verbal consent for this virtual visit at the beginning of the encounter.  Current Medications:  Current Outpatient Medications:    azithromycin (ZITHROMAX) 250 MG tablet, Take 2 tablets on day 1, then 1 tablet daily on days 2 through 5, Disp: 6 tablet, Rfl: 0   benzonatate (TESSALON) 100 MG capsule, Take 1 capsule (100 mg total) by mouth 3 (three) times daily as needed for cough., Disp: 30 capsule, Rfl: 0   predniSONE (DELTASONE) 20 MG tablet, Take 2 tablets (40 mg total) by mouth daily with breakfast., Disp: 10 tablet, Rfl: 0   albuterol (VENTOLIN HFA) 108 (90 Base) MCG/ACT inhaler, Inhale 2 puffs into the lungs every 6 (six) hours as needed for wheezing or shortness of breath., Disp: 18 g, Rfl: 5   budesonide-formoterol (SYMBICORT) 160-4.5 MCG/ACT inhaler, Inhale 2 puffs into the lungs 2 (two) times daily., Disp: 10.2 g, Rfl: 5   dicyclomine (BENTYL) 10 MG capsule, Take 1 capsule (10 mg total) by mouth 4 (four) times daily -  before meals and at bedtime., Disp: 90 capsule, Rfl: 5   famotidine (PEPCID) 20 MG tablet, TAKE 1 TABLET(20 MG) BY MOUTH TWICE DAILY, Disp: 180 tablet, Rfl: 0   fluticasone (FLONASE) 50 MCG/ACT nasal spray, SHAKE LIQUID AND USE 1 SPRAY IN EACH NOSTRIL TWICE DAILY, Disp: 48 g, Rfl: 6   lithium carbonate 150 MG capsule, Take 1 capsule by mouth in the morning and at bedtime., Disp: , Rfl:    lithium carbonate 300  MG capsule, Take 300 mg by mouth 2 (two) times daily., Disp: , Rfl:    OLANZapine (ZYPREXA) 10 MG tablet, Take 10 mg by mouth at bedtime., Disp: , Rfl:    rizatriptan (MAXALT) 10 MG tablet, Take 10 mg by mouth 2 (two) times daily as needed., Disp: , Rfl:   Current Facility-Administered Medications:    medroxyPROGESTERone (DEPO-PROVERA) injection 150 mg, 150 mg, Intramuscular, Q90 days, Jon Billings, NP, 150 mg at 03/29/22 0900   Medications ordered in this encounter:  Meds ordered this encounter  Medications   benzonatate (TESSALON) 100 MG capsule    Sig: Take 1 capsule (100 mg total) by mouth 3 (three) times daily as needed for cough.    Dispense:  30 capsule    Refill:  0    Order Specific Question:   Supervising Provider    Answer:   Chase Picket [[id number]]   predniSONE (DELTASONE) 20 MG tablet    Sig: Take 2 tablets (40 mg total) by mouth daily with breakfast.    Dispense:  10 tablet    Refill:  0    Order Specific Question:   Supervising Provider    Answer:   Chase Picket [[id number]]   azithromycin (ZITHROMAX) 250 MG tablet    Sig: Take 2 tablets on day 1, then 1 tablet daily on days 2 through 5    Dispense:  6 tablet    Refill:  0  Order Specific Question:   Supervising Provider    Answer:   Chase Picket [[id number]]     *If you need refills on other medications prior to your next appointment, please contact your pharmacy*  Follow-Up: Call back or seek an in-person evaluation if the symptoms worsen or if the condition fails to improve as anticipated.  [place] [phone]  Other Instructions Take antibiotic (Azithromycin) as directed.  Increase fluids.  Get plenty of rest. Use Mucinex for congestion. Use the Tessalon for cough. Make sure to take your Symbicort as directed. If breathing not improving with that, ok to take the prednisone as directed, monitoring for any changes in mood. . Take a daily probiotic (I recommend Align or  Culturelle, but even Activia Yogurt may be beneficial).  A humidifier placed in the bedroom may offer some relief for a dry, scratchy throat of nasal irritation.  Read information below on acute bronchitis. Please call or return to clinic if symptoms are not improving.  Acute Bronchitis Bronchitis is when the airways that extend from the windpipe into the lungs get red, puffy, and painful (inflamed). Bronchitis often causes thick spit (mucus) to develop. This leads to a cough. A cough is the most common symptom of bronchitis. In acute bronchitis, the condition usually begins suddenly and goes away over time (usually in 2 weeks). Smoking, allergies, and asthma can make bronchitis worse. Repeated episodes of bronchitis may cause more lung problems.  HOME CARE Rest. Drink enough fluids to keep your pee (urine) clear or pale yellow (unless you need to limit fluids as told by your doctor). Only take over-the-counter or prescription medicines as told by your doctor. Avoid smoking and secondhand smoke. These can make bronchitis worse. If you are a smoker, think about using nicotine gum or skin patches. Quitting smoking will help your lungs heal faster. Reduce the chance of getting bronchitis again by: Washing your hands often. Avoiding people with cold symptoms. Trying not to touch your hands to your mouth, nose, or eyes. Follow up with your doctor as told.  GET HELP IF: Your symptoms do not improve after 1 week of treatment. Symptoms include: Cough. Fever. Coughing up thick spit. Body aches. Chest congestion. Chills. Shortness of breath. Sore throat.  GET HELP RIGHT AWAY IF:  You have an increased fever. You have chills. You have severe shortness of breath. You have bloody thick spit (sputum). You throw up (vomit) often. You lose too much body fluid (dehydration). You have a severe headache. You faint.  MAKE SURE YOU:  Understand these instructions. Will watch your condition. Will  get help right away if you are not doing well or get worse. Document Released: 09/14/2007 Document Revised: 11/28/2012 Document Reviewed: 09/18/2012 [facility] Patient Information 2015 [place], [place]. This information is not intended to replace advice given to you by your health care provider. Make sure you discuss any questions you have with your health care provider.    If you have been instructed to have an in-person evaluation today at a local Urgent Care facility, please use the link below. It will take you to a list of all of our available [facility], including address, phone number and hours of operation. Please do not delay care.  [facility]  If you or a family member do not have a primary care provider, use the link below to schedule a visit and establish care. When you choose a [facility] primary care physician or advanced  practice provider, you gain a long-term partner in health. Find a Primary Care Provider  Learn more about [facility]'s in-office and virtual care options: [facility]

## 2022-05-10 NOTE — Telephone Encounter (Signed)
  Chief Complaint: Cough Symptoms: Cough, productive at times, slight yellowish, some wheezing at HS. Using inhalers more often, states they are effective. Frequency: 6 days Pertinent Negatives: Patient denies SOB, Fever Disposition: [] ED /[] Urgent Care (no appt availability in office) / [] Appointment(In office/virtual)/ [x]  [facility]/ [] Home Care/ [] Refused Recommended Disposition /[] [facility]/ []  Follow-up with PCP Additional Notes: No availability at practice today. [facility] secured for pt. Care advise provided, pt verbalizes understanding.  Reason for Disposition  [1] Continuous (nonstop) coughing interferes with work or school AND [2] no improvement using cough treatment per Care Advice  Answer Assessment - Initial Assessment Questions 1. ONSET: "When did the cough begin?"      Last Wednesday. 2. SEVERITY: "How bad is the cough today?"      Bad spells during the day but little better, wheezing at night. 3. SPUTUM: "Describe the color of your sputum" (none, dry cough; clear, white, yellow, green)     yellowish 4. HEMOPTYSIS: "Are you coughing up any blood?" If so ask: "How much?" (flecks, streaks, tablespoons, etc.)     no 5. DIFFICULTY BREATHING: "Are you having difficulty breathing?" If Yes, ask: "How bad is it?" (e.g., mild, moderate, severe)    - MILD: No SOB at rest, mild SOB with walking, speaks normally in sentences, can lie down, no retractions, pulse < 100.    - MODERATE: SOB at rest, SOB with minimal exertion and prefers to sit, cannot lie down flat, speaks in phrases, mild retractions, audible wheezing, pulse 100-120.    - SEVERE: Very SOB at rest, speaks in single words, struggling to breathe, sitting hunched forward, retractions, pulse > 120      No 6. FEVER: "Do you have a fever?" If Yes, ask: "What is your temperature, how was it measured, and when did it start?"     no 7. CARDIAC HISTORY: "Do you have any history of heart disease?" (e.g.,  heart attack, congestive heart failure)      NA 8. LUNG HISTORY: "Do you have any history of lung disease?"  (e.g., pulmonary embolus, asthma, emphysema)     NA 9. PE RISK FACTORS: "Do you have a history of blood clots?" (or: recent major surgery, recent prolonged travel, bedridden) NA   10. OTHER SYMPTOMS: "Do you have any other symptoms?" (e.g., runny nose, wheezing, chest pain)       Wheezing at night, runny nose mild  Protocols used: Cough - Acute Productive-A-AH

## 2022-05-10 NOTE — Progress Notes (Signed)
Virtual Visit Consent   Kelsey Aguirre, you are scheduled for a virtual visit with a [facility] provider today. Just as with appointments in the office, your consent must be obtained to participate. Your consent will be active for this visit and any virtual visit you may have with one of our providers in the next 365 days. If you have a MyChart account, a copy of this consent can be sent to you electronically.  As this is a virtual visit, video technology does not allow for your provider to perform a traditional examination. This may limit your provider's ability to fully assess your condition. If your provider identifies any concerns that need to be evaluated in person or the need to arrange testing (such as labs, EKG, etc.), we will make arrangements to do so. Although advances in technology are sophisticated, we cannot ensure that it will always work on either your end or our end. If the connection with a video visit is poor, the visit may have to be switched to a telephone visit. With either a video or telephone visit, we are not always able to ensure that we have a secure connection.  By engaging in this virtual visit, you consent to the provision of healthcare and authorize for your insurance to be billed (if applicable) for the services provided during this visit. Depending on your insurance coverage, you may receive a charge related to this service.  I need to obtain your verbal consent now. Are you willing to proceed with your visit today? Kelsey Aguirre has provided verbal consent on 05/10/2022 for a virtual visit (video or telephone). Leeanne Rio, [place]  Date: 05/10/2022 11:48 AM  Virtual Visit via Video Note   I, Leeanne Rio, connected with  Kelsey Aguirre  ([id number], [birth year]) on 05/10/22 at 12:00 PM EST by a video-enabled telemedicine application and verified that I am speaking with the correct person using two identifiers.  Location: Patient: Virtual Visit  Location Patient: Home Provider: Virtual Visit Location Provider: Home Office   I discussed the limitations of evaluation and management by telemedicine and the availability of in person appointments. The patient expressed understanding and agreed to proceed.    History of Present Illness: Kelsey Aguirre is a 22 y.o. who identifies as a female who was assigned female at birth, and is being seen today for URI symptoms starting over a week ago with nasal congestion, sinus pressure, cough and wheezing. Cough has become productive of yellow sputum. Denies fever, chills. Has been taking her Symbicort but not routinely. Has had to use her albuterol inhaler in the past few days.     HPI: HPI  Problems:  Patient Active Problem List   Diagnosis Date Noted   Bipolar 1 disorder ([place])    Moderate persistent asthma with exacerbation 01/28/2021   Migraine variant with headache 03/31/2020   Self-injurious behavior 03/31/2020   Vitamin D deficiency 07/04/2019   Asthma 01/25/2018   Anxiety 01/25/2018   Depression 01/25/2018   Scoliosis 01/22/2018    Allergies:  Allergies  Allergen Reactions   Vraylar [Cariprazine Hcl] Nausea And Vomiting   Zyrtec [Cetirizine] Nausea And Vomiting   Medications:  Current Outpatient Medications:    albuterol (VENTOLIN HFA) 108 (90 Base) MCG/ACT inhaler, Inhale 2 puffs into the lungs every 6 (six) hours as needed for wheezing or shortness of breath., Disp: 18 g, Rfl: 5   budesonide-formoterol (SYMBICORT) 160-4.5 MCG/ACT inhaler, Inhale 2 puffs into the lungs 2 (two) times  daily., Disp: 10.2 g, Rfl: 5   dicyclomine (BENTYL) 10 MG capsule, Take 1 capsule (10 mg total) by mouth 4 (four) times daily -  before meals and at bedtime., Disp: 90 capsule, Rfl: 5   famotidine (PEPCID) 20 MG tablet, TAKE 1 TABLET(20 MG) BY MOUTH TWICE DAILY, Disp: 180 tablet, Rfl: 0   fluticasone (FLONASE) 50 MCG/ACT nasal spray, SHAKE LIQUID AND USE 1 SPRAY IN EACH NOSTRIL TWICE DAILY, Disp:  48 g, Rfl: 6   lithium carbonate 150 MG capsule, Take 1 capsule by mouth in the morning and at bedtime., Disp: , Rfl:    lithium carbonate 300 MG capsule, Take 300 mg by mouth 2 (two) times daily., Disp: , Rfl:    OLANZapine (ZYPREXA) 10 MG tablet, Take 10 mg by mouth at bedtime., Disp: , Rfl:    rizatriptan (MAXALT) 10 MG tablet, Take 10 mg by mouth 2 (two) times daily as needed., Disp: , Rfl:   Current Facility-Administered Medications:    medroxyPROGESTERone (DEPO-PROVERA) injection 150 mg, 150 mg, Intramuscular, Q90 days, Jon Billings, NP, 150 mg at 03/29/22 0900  Observations/Objective: Patient is well-developed, well-nourished in no acute distress.  Resting comfortably at home.  Head is normocephalic, atraumatic.  No labored breathing. Speech is clear and coherent with logical content.  Patient is alert and oriented at baseline.   Assessment and Plan: 1. Moderate persistent asthmatic bronchitis with acute exacerbation  With concern for secondary bacterial infection. Will first have her hydrate and rest. Supportive measures and OTC medications reviewed. Will start Azithromycin. Tessalon per orders. She is to resume proper dosing of her Symbicort to see if this further helps calm her asthma. If not doing it alone and with the albuterol, she is to start the short-course of prednisone as directed, monitoring mood giving history of BPD. (She notes tolerating prednisone in the past for asthma without any issue)  Follow Up Instructions: I discussed the assessment and treatment plan with the patient. The patient was provided an opportunity to ask questions and all were answered. The patient agreed with the plan and demonstrated an understanding of the instructions.  A copy of instructions were sent to the patient via MyChart unless otherwise noted below.   The patient was advised to call back or seek an in-person evaluation if the symptoms worsen or if the condition fails to improve as  anticipated.  Time:  I spent 10 minutes with the patient via telehealth technology discussing the above problems/concerns.    Leeanne Rio, PA-C

## 2022-05-13 ENCOUNTER — Other Ambulatory Visit: Payer: Self-pay | Admitting: Nurse Practitioner

## 2022-05-13 NOTE — Telephone Encounter (Signed)
Requested Prescriptions  Pending Prescriptions Disp Refills   albuterol (VENTOLIN HFA) 108 (90 Base) MCG/ACT inhaler [Pharmacy Med Name: ALBUTEROL HFA INH(200 PUFFS) 18GM] 18 g 2    Sig: INHALE 2 PUFFS BY MOUTH EVERY 6 HOURS AS NEEDED FOR WHEEZING OR SHORTNESS OF BREATH     Pulmonology:  Beta Agonists 2 Passed - 05/13/2022  5:56 PM      Passed - Last BP in normal range    BP Readings from Last 1 Encounters:  01/11/22 124/77         Passed - Last Heart Rate in normal range    Pulse Readings from Last 1 Encounters:  01/11/22 85         Passed - Valid encounter within last 12 months    Recent Outpatient Visits           4 months ago Annual physical exam   [place], NP   6 months ago [place] Kathrine Haddock, NP   6 months ago Right lower quadrant abdominal pain   [place], NP   10 months ago Encounter for contraceptive management, unspecified type   [place], NP   11 months ago Bipolar 1 disorder [facility])   [place] Jon Billings, NP       Future Appointments             In 2 months Jon Billings, NP [place], [facility]

## 2022-06-19 ENCOUNTER — Other Ambulatory Visit: Payer: Self-pay | Admitting: Gastroenterology

## 2022-06-20 ENCOUNTER — Other Ambulatory Visit: Payer: Self-pay | Admitting: Gastroenterology

## 2022-06-21 ENCOUNTER — Ambulatory Visit (INDEPENDENT_AMBULATORY_CARE_PROVIDER_SITE_OTHER): Payer: Medicaid Other

## 2022-06-21 DIAGNOSIS — Z309 Encounter for contraceptive management, unspecified: Secondary | ICD-10-CM

## 2022-06-21 DIAGNOSIS — Z3042 Encounter for surveillance of injectable contraceptive: Secondary | ICD-10-CM

## 2022-07-13 ENCOUNTER — Encounter: Payer: Self-pay | Admitting: Nurse Practitioner

## 2022-07-13 ENCOUNTER — Ambulatory Visit (INDEPENDENT_AMBULATORY_CARE_PROVIDER_SITE_OTHER): Payer: Medicaid Other | Admitting: Nurse Practitioner

## 2022-07-13 VITALS — BP 139/76 | HR 93 | Temp 97.8°F | Ht 64.0 in | Wt 173.8 lb

## 2022-07-13 DIAGNOSIS — F319 Bipolar disorder, unspecified: Secondary | ICD-10-CM | POA: Diagnosis not present

## 2022-07-13 NOTE — Progress Notes (Signed)
BP 139/76   Pulse 93   Temp 97.8 F (36.6 C) (Oral)   Ht 5\' 4"  (1.626 m)   Wt 173 lb 12.8 oz (78.8 kg)   SpO2 97%   BMI 29.83 kg/m    Subjective:    Patient ID: Kelsey Aguirre, female    DOB: [birth year], 22 y.o.   MRN: [id number]  HPI: Kelsey Aguirre is a 22 y.o. female  Chief Complaint  Patient presents with   Depression   Anxiety   Patient presents to clinic for follow up on her mood.  She is still seeing psychiatry.  They have adjusted her medications due to weight gain with Zyprexa.  She feels like she is doing well.  She also continues to take Lithium.  Denies concerns at visit today.     Relevant past medical, surgical, family and social history reviewed and updated as indicated. Interim medical history since our last visit reviewed. Allergies and medications reviewed and updated.  Review of Systems  Psychiatric/Behavioral:  Positive for dysphoric mood. Negative for suicidal ideas. The patient is nervous/anxious.     Per HPI unless specifically indicated above     Objective:    BP 139/76   Pulse 93   Temp 97.8 F (36.6 C) (Oral)   Ht 5\' 4"  (1.626 m)   Wt 173 lb 12.8 oz (78.8 kg)   SpO2 97%   BMI 29.83 kg/m   Wt Readings from Last 3 Encounters:  07/13/22 173 lb 12.8 oz (78.8 kg)  01/11/22 172 lb 3.2 oz (78.1 kg)  10/20/21 171 lb 9.6 oz (77.8 kg) (92 %, Z= 1.42)*   * Growth percentiles are based on CDC (Girls, 2-20 Years) data.    Physical Exam Vitals and nursing note reviewed.  Constitutional:      General: She is not in acute distress.    Appearance: Normal appearance. She is normal weight. She is not ill-appearing, toxic-appearing or diaphoretic.  HENT:     Head: Normocephalic.     Right Ear: External ear normal.     Left Ear: External ear normal.     Nose: Nose normal.     Mouth/Throat:     Mouth: Mucous membranes are moist.     Pharynx: Oropharynx is clear.  Eyes:     General:        Right eye: No discharge.        Left eye: No  discharge.     Extraocular Movements: Extraocular movements intact.     Conjunctiva/sclera: Conjunctivae normal.     Pupils: Pupils are equal, round, and reactive to light.  Cardiovascular:     Rate and Rhythm: Normal rate and regular rhythm.     Heart sounds: No murmur heard. Pulmonary:     Effort: Pulmonary effort is normal. No respiratory distress.     Breath sounds: Normal breath sounds. No wheezing or rales.  Abdominal:     General: Abdomen is flat. Bowel sounds are normal. There is no distension.     Palpations: Abdomen is soft.     Tenderness: There is no abdominal tenderness. There is no guarding.  Musculoskeletal:     Cervical back: Normal range of motion and neck supple.  Skin:    General: Skin is warm and dry.     Capillary Refill: Capillary refill takes less than 2 seconds.  Neurological:     General: No focal deficit present.     Mental Status: She is alert and oriented to  person, place, and time. Mental status is at baseline.  Psychiatric:        Mood and Affect: Mood normal.        Behavior: Behavior normal.        Thought Content: Thought content normal.        Judgment: Judgment normal.     Results for orders placed or performed in visit on 01/11/22  Chlamydia/Gonococcus/Trichomonas, NAA([facility])   Specimen: Urine   UR  Result Value Ref Range   Chlamydia by NAA Negative Negative   Gonococcus by NAA Negative Negative   Trich vag by NAA Negative Negative  Microscopic Examination   Urine  Result Value Ref Range   WBC, UA 0-5 0 - 5 /hpf   RBC, Urine 0-2 0 - 2 /hpf   Epithelial Cells (non renal) >10E 0 - 10 /hpf   Bacteria, UA Few (A) None seen/Few  CBC with Differential/Platelet  Result Value Ref Range   WBC 7.0 3.4 - 10.8 x10E3/uL   RBC 4.30 3.77 - 5.28 x10E6/uL   Hemoglobin 13.2 11.1 - 15.9 g/dL   Hematocrit 38.1 34.0 - 46.6 %   MCV 89 79 - 97 fL   MCH 30.7 26.6 - 33.0 pg   MCHC 34.6 31.5 - 35.7 g/dL   RDW 12.4 11.7 - 15.4 %   Platelets 270 150 -  450 x10E3/uL   Neutrophils 68 Not Estab. %   Lymphs 24 Not Estab. %   Monocytes 6 Not Estab. %   Eos 2 Not Estab. %   Basos 0 Not Estab. %   Neutrophils Absolute 4.7 1.4 - 7.0 x10E3/uL   Lymphocytes Absolute 1.7 0.7 - 3.1 x10E3/uL   Monocytes Absolute 0.4 0.1 - 0.9 x10E3/uL   EOS (ABSOLUTE) 0.2 0.0 - 0.4 x10E3/uL   Basophils Absolute 0.0 0.0 - 0.2 x10E3/uL   Immature Granulocytes 0 Not Estab. %   Immature Grans (Abs) 0.0 0.0 - 0.1 x10E3/uL  Comprehensive metabolic panel  Result Value Ref Range   Glucose 87 70 - 99 mg/dL   BUN 10 6 - 20 mg/dL   Creatinine, Ser 0.88 0.57 - 1.00 mg/dL   eGFR 96 >59 mL/min/1.73   BUN/Creatinine Ratio 11 9 - 23   Sodium 139 134 - 144 mmol/L   Potassium 4.4 3.5 - 5.2 mmol/L   Chloride 107 (H) 96 - 106 mmol/L   CO2 18 (L) 20 - 29 mmol/L   Calcium 9.9 8.7 - 10.2 mg/dL   Total Protein 6.9 6.0 - 8.5 g/dL   Albumin 4.8 4.0 - 5.0 g/dL   Globulin, Total 2.1 1.5 - 4.5 g/dL   Albumin/Globulin Ratio 2.3 (H) 1.2 - 2.2   Bilirubin Total 0.4 0.0 - 1.2 mg/dL   Alkaline Phosphatase 97 42 - 106 IU/L   AST 13 0 - 40 IU/L   ALT 13 0 - 32 IU/L  Lipid panel  Result Value Ref Range   Cholesterol, Total 150 100 - 199 mg/dL   Triglycerides 117 0 - 149 mg/dL   HDL 33 (L) >39 mg/dL   VLDL Cholesterol Cal 21 5 - 40 mg/dL   LDL Chol Calc (NIH) 96 0 - 99 mg/dL   Chol/HDL Ratio 4.5 (H) 0.0 - 4.4 ratio  TSH  Result Value Ref Range   TSH 3.060 0.450 - 4.500 uIU/mL  Urinalysis, Routine w reflex microscopic  Result Value Ref Range   Specific Gravity, UA 1.020 1.005 - 1.030   pH, UA 7.5 5.0 - 7.5  Color, UA Yellow Yellow   Appearance Ur Clear Clear   Leukocytes,UA Trace (A) Negative   Protein,UA Negative Negative/Trace   Glucose, UA Negative Negative   Ketones, UA Negative Negative   RBC, UA Negative Negative   Bilirubin, UA Negative Negative   Urobilinogen, Ur 0.2 0.2 - 1.0 mg/dL   Nitrite, UA Negative Negative   Microscopic Examination See below:        Assessment & Plan:   Problem List Items Addressed This Visit       Other   Bipolar 1 disorder - Primary    Chronic.  Controlled.  Continue with current medication regimen on Lithium and Lybalvi.   Followed by Psychiatry.  Return to clinic in 6 months for reevaluation.  Call sooner if concerns arise.         Follow up plan: Return in about 7 months (around 02/12/2023) for Physical and Fasting labs.

## 2022-07-13 NOTE — Assessment & Plan Note (Signed)
Chronic.  Controlled.  Continue with current medication regimen on Lithium and Lybalvi.   Followed by Psychiatry.  Return to clinic in 6 months for reevaluation.  Call sooner if concerns arise.

## 2022-08-08 ENCOUNTER — Other Ambulatory Visit: Payer: Self-pay | Admitting: Gastroenterology

## 2022-09-04 ENCOUNTER — Other Ambulatory Visit: Payer: Self-pay | Admitting: Nurse Practitioner

## 2022-09-06 NOTE — Telephone Encounter (Signed)
Requested Prescriptions  Pending Prescriptions Disp Refills   SYMBICORT 160-4.5 MCG/ACT inhaler [Pharmacy Med Name: SYMBICORT 160/4. (120 ORAL INH)] 10.2 g 5    Sig: INHALE 2 PUFFS INTO THE LUNGS TWICE DAILY     Pulmonology:  Combination Products Passed - 09/04/2022  9:13 PM      Passed - Valid encounter within last 12 months    Recent Outpatient Visits           1 month ago Bipolar 1 disorder (HCC)   [place] [facility] Larae Grooms, NP   7 months ago Annual physical exam   [place] [facility] Larae Grooms, NP   10 months ago COVID   [place] [facility] Gabriel Cirri, NP   10 months ago Right lower quadrant abdominal pain   [place] [facility] Larae Grooms, NP   1 year ago Encounter for contraceptive management, unspecified type   [place] [facility] Larae Grooms, NP       Future Appointments             In 2 weeks Midge Minium, MD [facility]   In 5 months Larae Grooms, NP [place] [facility]

## 2022-09-14 ENCOUNTER — Ambulatory Visit (INDEPENDENT_AMBULATORY_CARE_PROVIDER_SITE_OTHER): Payer: Medicaid Other

## 2022-09-14 DIAGNOSIS — Z309 Encounter for contraceptive management, unspecified: Secondary | ICD-10-CM | POA: Diagnosis not present

## 2022-09-14 DIAGNOSIS — Z3042 Encounter for surveillance of injectable contraceptive: Secondary | ICD-10-CM | POA: Diagnosis not present

## 2022-09-14 MED ORDER — MEDROXYPROGESTERONE ACETATE 150 MG/ML IM SUSY
150.0000 mg | PREFILLED_SYRINGE | Freq: Once | INTRAMUSCULAR | Status: AC
  Administered 2022-09-14: 150 mg via INTRAMUSCULAR

## 2022-09-21 NOTE — Progress Notes (Signed)
Primary Care Physician: Larae Grooms, NP  Primary Gastroenterologist:  Dr. Midge Minium  Chief Complaint  Patient presents with   Iron Deficiency Anemia   Constipation    Averaging every other day BM    HPI: Kelsey Aguirre is a 22 y.o. female here with a history of seeing me in the past for irritable bowel syndrome.  The patient was seen by me back in 2023 with irritable bowel syndrome with alternating diarrhea and constipation.  The patient was told to increase fiber and was told about a low FODMAP diet that she was not able to tolerate in the past.  The patient also had intermittent heartburn for which she was taking Tums.  It was reported that the patient had iron deficiency anemia but her most recent labs did not show any sign of anemia and no iron studies were readily available on the patient's chart. She reports that her diarrhea has been changed to constipation. She reports that her abdominal pain is quite significant when she has constipation but it is relieved when she moves her bowels.  Past Medical History:  Diagnosis Date   Allergy    Anxiety    Asthma    Bipolar 1 disorder (HCC)    Depression    Scoliosis     Current Outpatient Medications  Medication Sig Dispense Refill   albuterol (VENTOLIN HFA) 108 (90 Base) MCG/ACT inhaler INHALE 2 PUFFS BY MOUTH EVERY 6 HOURS AS NEEDED FOR WHEEZING OR SHORTNESS OF BREATH 18 g 2   budesonide-formoterol (SYMBICORT) 160-4.5 MCG/ACT inhaler INHALE 2 PUFFS INTO THE LUNGS TWICE DAILY 10.2 g 4   dicyclomine (BENTYL) 10 MG capsule Take 1 capsule (10 mg total) by mouth 4 (four) times daily -  before meals and at bedtime. 360 capsule 0   famotidine (PEPCID) 20 MG tablet TAKE 1 TABLET(20 MG) BY MOUTH TWICE DAILY 180 tablet 0   fluticasone (FLONASE) 50 MCG/ACT nasal spray SHAKE LIQUID AND USE 1 SPRAY IN EACH NOSTRIL TWICE DAILY 48 g 6   lithium carbonate 150 MG capsule Take 1 capsule by mouth daily.     lithium carbonate 300 MG  capsule Take 300 mg by mouth 2 (two) times daily. Take 1 300mg  in the am and 2 300mg  in the pm     LYBALVI 15-10 MG TABS Take 1 tablet by mouth daily.     rizatriptan (MAXALT) 10 MG tablet Take 10 mg by mouth 2 (two) times daily as needed.     Current Facility-Administered Medications  Medication Dose Route Frequency Provider Last Rate Last Admin   medroxyPROGESTERone (DEPO-PROVERA) injection 150 mg  150 mg Intramuscular Q90 days Larae Grooms, NP   150 mg at 06/21/22 1154    Allergies as of 09/22/2022 - Review Complete 09/22/2022  Allergen Reaction Noted   Vraylar [cariprazine hcl] Nausea And Vomiting 05/10/2022   Zyrtec [cetirizine] Nausea And Vomiting 05/18/2018    ROS:  General: Negative for anorexia, weight loss, fever, chills, fatigue, weakness. ENT: Negative for hoarseness, difficulty swallowing , nasal congestion. CV: Negative for chest pain, angina, palpitations, dyspnea on exertion, peripheral edema.  Respiratory: Negative for dyspnea at rest, dyspnea on exertion, cough, sputum, wheezing.  GI: See history of present illness. GU:  Negative for dysuria, hematuria, urinary incontinence, urinary frequency, nocturnal urination.  Endo: Negative for unusual weight change.    Physical Examination:   BP 119/81 (BP Location: Left Arm, Patient Position: Sitting, Cuff Size: Normal)   Pulse 74   Temp  98.2 F (36.8 C) (Oral)   Wt 165 lb (74.8 kg)   BMI 28.32 kg/m   General: Well-nourished, well-developed in no acute distress.  Eyes: No icterus. Conjunctivae pink. Neuro: Alert and oriented x 3.  Grossly intact. Skin: Warm and dry, no jaundice.   Psych: Alert and cooperative, normal mood and affect.  Labs:    Imaging Studies: No results found.  Assessment and Plan:   Kelsey Aguirre is a 22 y.o. y/o female who comes in today with a history of irritable bowel syndrome with alternating diarrhea and constipation that she reports to have changed to mostly constipation.   The patient will be started on a low-dose of Linzess and has been given samples.  The patient will let me know if the symptoms are improved with the 72 mcg and if not we can go up on the medication.  The patient has been explained the plan and agrees with it.     Midge Minium, MD. Clementeen Graham    Note: This dictation was prepared with Dragon dictation along with smaller phrase technology. Any transcriptional errors that result from this process are unintentional.

## 2022-09-22 ENCOUNTER — Ambulatory Visit (INDEPENDENT_AMBULATORY_CARE_PROVIDER_SITE_OTHER): Payer: Medicaid Other | Admitting: Gastroenterology

## 2022-09-22 ENCOUNTER — Encounter: Payer: Self-pay | Admitting: Gastroenterology

## 2022-09-22 VITALS — BP 119/81 | HR 74 | Temp 98.2°F | Wt 165.0 lb

## 2022-09-22 DIAGNOSIS — K582 Mixed irritable bowel syndrome: Secondary | ICD-10-CM | POA: Diagnosis not present

## 2022-09-22 MED ORDER — LINACLOTIDE 72 MCG PO CAPS
72.0000 ug | ORAL_CAPSULE | Freq: Every day | ORAL | 0 refills | Status: DC
Start: 2022-09-22 — End: 2023-02-13

## 2022-09-22 NOTE — Patient Instructions (Signed)
Samples of Linzess 72 mcg given to take 30 minutes before breakfast

## 2022-11-06 ENCOUNTER — Other Ambulatory Visit: Payer: Self-pay | Admitting: Gastroenterology

## 2022-12-07 ENCOUNTER — Ambulatory Visit (INDEPENDENT_AMBULATORY_CARE_PROVIDER_SITE_OTHER): Payer: MEDICAID

## 2022-12-07 DIAGNOSIS — Z3042 Encounter for surveillance of injectable contraceptive: Secondary | ICD-10-CM | POA: Diagnosis not present

## 2022-12-07 MED ORDER — MEDROXYPROGESTERONE ACETATE 150 MG/ML IM SUSY
150.0000 mg | PREFILLED_SYRINGE | Freq: Once | INTRAMUSCULAR | Status: AC
  Administered 2022-12-07: 150 mg via INTRAMUSCULAR

## 2023-02-10 NOTE — Progress Notes (Unsigned)
There were no vitals taken for this visit.   Subjective:    Patient ID: Kelsey Aguirre, female    DOB: 30-Apr-2000, 22 y.o.   MRN: [id number]  HPI: Kelsey Aguirre is a 22 y.o. female presenting on 02/13/2023 for comprehensive medical examination. Current medical complaints include:none  She currently lives with: Menopausal Symptoms: no  MOOD Patient states she is still on Zyprexa and Lithium.  She just saw her psychiatrist on Friday and things are going well.  Denies concerns at visit today.    Depression Screen done today and results listed below:     07/13/2022    8:10 AM 01/11/2022    9:52 AM 11/03/2021    9:31 AM 10/20/2021    3:49 PM 07/12/2021    9:45 AM  Depression screen PHQ 2/9  Decreased Interest 0 0 0 0 0  Down, Depressed, Hopeless 0 0 0 0 0  PHQ - 2 Score 0 0 0 0 0  Altered sleeping 2 0 1 2 3   Tired, decreased energy 2 0 0 2 0  Change in appetite 2 2 1 2 3   Feeling bad or failure about yourself  0 0 0 0 0  Trouble concentrating 0 0 0 0 0  Moving slowly or fidgety/restless 0 0 0 0 0  Suicidal thoughts 0 0 0 0 0  PHQ-9 Score 6 2 2 6 6   Difficult doing work/chores Somewhat difficult Not difficult at all Not difficult at all Somewhat difficult Somewhat difficult    The patient does not have a history of falls. I did complete a risk assessment for falls. A plan of care for falls was documented.   Past Medical History:  Past Medical History:  Diagnosis Date  . Allergy   . Anxiety   . Asthma   . Bipolar 1 disorder (HCC)   . Depression   . Scoliosis     Surgical History:  Past Surgical History:  Procedure Laterality Date  . TONSILLECTOMY      Medications:  Current Outpatient Medications on File Prior to Visit  Medication Sig  . albuterol (VENTOLIN HFA) 108 (90 Base) MCG/ACT inhaler INHALE 2 PUFFS BY MOUTH EVERY 6 HOURS AS NEEDED FOR WHEEZING OR SHORTNESS OF BREATH  . budesonide-formoterol (SYMBICORT) 160-4.5 MCG/ACT inhaler INHALE 2 PUFFS INTO THE  LUNGS TWICE DAILY  . dicyclomine (BENTYL) 10 MG capsule Take 1 capsule (10 mg total) by mouth 4 (four) times daily -  before meals and at bedtime.  . famotidine (PEPCID) 20 MG tablet TAKE 1 TABLET(20 MG) BY MOUTH TWICE DAILY  . fluticasone (FLONASE) 50 MCG/ACT nasal spray SHAKE LIQUID AND USE 1 SPRAY IN EACH NOSTRIL TWICE DAILY  . linaclotide (LINZESS) 72 MCG capsule Take 1 capsule (72 mcg total) by mouth daily before breakfast.  . lithium carbonate 150 MG capsule Take 1 capsule by mouth daily.  Marland Kitchen lithium carbonate 300 MG capsule Take 300 mg by mouth 2 (two) times daily. Take 1 300mg  in the am and 2 300mg  in the pm  . LYBALVI 15-10 MG TABS Take 1 tablet by mouth daily.  . rizatriptan (MAXALT) 10 MG tablet Take 10 mg by mouth 2 (two) times daily as needed.   No current facility-administered medications on file prior to visit.    Allergies:  Allergies  Allergen Reactions  . Vraylar [Cariprazine Hcl] Nausea And Vomiting  . Zyrtec [Cetirizine] Nausea And Vomiting    Social History:  Social History   Socioeconomic History  .  Marital status: Single    Spouse name: Not on file  . Number of children: Not on file  . Years of education: Not on file  . Highest education level: Not on file  Occupational History  . Not on file  Tobacco Use  . Smoking status: Never  . Smokeless tobacco: Never  Vaping Use  . Vaping status: Never Used  Substance and Sexual Activity  . Alcohol use: No  . Drug use: No  . Sexual activity: Yes    Birth control/protection: Injection  Other Topics Concern  . Not on file  Social History Narrative  . Not on file   Social Determinants of Health   Financial Resource Strain: Not on file  Food Insecurity: Not on file  Transportation Needs: Not on file  Physical Activity: Not on file  Stress: Not on file  Social Connections: Not on file  Intimate Partner Violence: Not on file   Social History   Tobacco Use  Smoking Status Never  Smokeless Tobacco Never    Social History   Substance and Sexual Activity  Alcohol Use No    Family History:  Family History  Problem Relation Age of Onset  . Drug abuse Mother   . Hepatitis C Mother   . Hypertension Mother   . Hepatitis B Father   . HIV Father   . Cancer Maternal Grandmother   . Hypertension Maternal Grandmother   . Osteoporosis Maternal Grandmother   . Heart disease Maternal Grandfather   . Lung disease Paternal Grandmother     Past medical history, surgical history, medications, allergies, family history and social history reviewed with patient today and changes made to appropriate areas of the chart.   Review of Systems  Eyes:  Negative for blurred vision and double vision.  Respiratory:  Negative for shortness of breath.   Cardiovascular:  Negative for chest pain, palpitations and leg swelling.  Neurological:  Negative for dizziness and headaches.  Psychiatric/Behavioral:  Negative for depression and suicidal ideas. The patient is not nervous/anxious.    All other ROS negative except what is listed above and in the HPI.      Objective:    There were no vitals taken for this visit.  Wt Readings from Last 3 Encounters:  09/22/22 165 lb (74.8 kg)  07/13/22 173 lb 12.8 oz (78.8 kg)  01/11/22 172 lb 3.2 oz (78.1 kg)    Physical Exam Vitals and nursing note reviewed.  Constitutional:      General: She is awake. She is not in acute distress.    Appearance: Normal appearance. She is well-developed and normal weight. She is not ill-appearing.  HENT:     Head: Normocephalic and atraumatic.     Right Ear: Hearing, tympanic membrane, ear canal and external ear normal. No drainage.     Left Ear: Hearing, tympanic membrane, ear canal and external ear normal. No drainage.     Nose: Nose normal.     Right Sinus: No maxillary sinus tenderness or frontal sinus tenderness.     Left Sinus: No maxillary sinus tenderness or frontal sinus tenderness.     Mouth/Throat:     Mouth: Mucous  membranes are moist.     Pharynx: Oropharynx is clear. Uvula midline. No pharyngeal swelling, oropharyngeal exudate or posterior oropharyngeal erythema.  Eyes:     General: Lids are normal.        Right eye: No discharge.        Left eye: No discharge.  Extraocular Movements: Extraocular movements intact.     Conjunctiva/sclera: Conjunctivae normal.     Pupils: Pupils are equal, round, and reactive to light.     Visual Fields: Right eye visual fields normal and left eye visual fields normal.  Neck:     Thyroid: No thyromegaly.     Vascular: No carotid bruit.     Trachea: Trachea normal.  Cardiovascular:     Rate and Rhythm: Normal rate and regular rhythm.     Heart sounds: Normal heart sounds. No murmur heard.    No gallop.  Pulmonary:     Effort: Pulmonary effort is normal. No accessory muscle usage or respiratory distress.     Breath sounds: Normal breath sounds.  Chest:  Breasts:    Right: Normal.     Left: Normal.  Abdominal:     General: Bowel sounds are normal.     Palpations: Abdomen is soft. There is no hepatomegaly or splenomegaly.     Tenderness: There is no abdominal tenderness.  Musculoskeletal:        General: Normal range of motion.     Cervical back: Normal range of motion and neck supple.     Right lower leg: No edema.     Left lower leg: No edema.  Lymphadenopathy:     Head:     Right side of head: No submental, submandibular, tonsillar, preauricular or posterior auricular adenopathy.     Left side of head: No submental, submandibular, tonsillar, preauricular or posterior auricular adenopathy.     Cervical: No cervical adenopathy.     Upper Body:     Right upper body: No supraclavicular, axillary or pectoral adenopathy.     Left upper body: No supraclavicular, axillary or pectoral adenopathy.  Skin:    General: Skin is warm and dry.     Capillary Refill: Capillary refill takes less than 2 seconds.     Findings: No rash.  Neurological:     Mental  Status: She is alert and oriented to person, place, and time.     Gait: Gait is intact.     Deep Tendon Reflexes: Reflexes are normal and symmetric.     Reflex Scores:      Brachioradialis reflexes are 2+ on the right side and 2+ on the left side.      Patellar reflexes are 2+ on the right side and 2+ on the left side. Psychiatric:        Attention and Perception: Attention normal.        Mood and Affect: Mood normal.        Speech: Speech normal.        Behavior: Behavior normal. Behavior is cooperative.        Thought Content: Thought content normal.        Judgment: Judgment normal.    Results for orders placed or performed in visit on 01/11/22  Chlamydia/Gonococcus/Trichomonas, NAA([facility])   Specimen: Urine   UR  Result Value Ref Range   Chlamydia by NAA Negative Negative   Gonococcus by NAA Negative Negative   Trich vag by NAA Negative Negative  Microscopic Examination   Urine  Result Value Ref Range   WBC, UA 0-5 0 - 5 /hpf   RBC, Urine 0-2 0 - 2 /hpf   Epithelial Cells (non renal) >10E 0 - 10 /hpf   Bacteria, UA Few (A) None seen/Few  CBC with Differential/Platelet  Result Value Ref Range   WBC 7.0 3.4 - 10.8 x10E3/uL  RBC 4.30 3.77 - 5.28 x10E6/uL   Hemoglobin 13.2 11.1 - 15.9 g/dL   Hematocrit 43.3 29.5 - 46.6 %   MCV 89 79 - 97 fL   MCH 30.7 26.6 - 33.0 pg   MCHC 34.6 31.5 - 35.7 g/dL   RDW 18.8 41.6 - 60.6 %   Platelets 270 150 - 450 x10E3/uL   Neutrophils 68 Not Estab. %   Lymphs 24 Not Estab. %   Monocytes 6 Not Estab. %   Eos 2 Not Estab. %   Basos 0 Not Estab. %   Neutrophils Absolute 4.7 1.4 - 7.0 x10E3/uL   Lymphocytes Absolute 1.7 0.7 - 3.1 x10E3/uL   Monocytes Absolute 0.4 0.1 - 0.9 x10E3/uL   EOS (ABSOLUTE) 0.2 0.0 - 0.4 x10E3/uL   Basophils Absolute 0.0 0.0 - 0.2 x10E3/uL   Immature Granulocytes 0 Not Estab. %   Immature Grans (Abs) 0.0 0.0 - 0.1 x10E3/uL  Comprehensive metabolic panel  Result Value Ref Range   Glucose 87 70 - 99 mg/dL    BUN 10 6 - 20 mg/dL   Creatinine, Ser 3.01 0.57 - 1.00 mg/dL   eGFR 96 >60 FU/XNA/3.55   BUN/Creatinine Ratio 11 9 - 23   Sodium 139 134 - 144 mmol/L   Potassium 4.4 3.5 - 5.2 mmol/L   Chloride 107 (H) 96 - 106 mmol/L   CO2 18 (L) 20 - 29 mmol/L   Calcium 9.9 8.7 - 10.2 mg/dL   Total Protein 6.9 6.0 - 8.5 g/dL   Albumin 4.8 4.0 - 5.0 g/dL   Globulin, Total 2.1 1.5 - 4.5 g/dL   Albumin/Globulin Ratio 2.3 (H) 1.2 - 2.2   Bilirubin Total 0.4 0.0 - 1.2 mg/dL   Alkaline Phosphatase 97 42 - 106 IU/L   AST 13 0 - 40 IU/L   ALT 13 0 - 32 IU/L  Lipid panel  Result Value Ref Range   Cholesterol, Total 150 100 - 199 mg/dL   Triglycerides 732 0 - 149 mg/dL   HDL 33 (L) >20 mg/dL   VLDL Cholesterol Cal 21 5 - 40 mg/dL   LDL Chol Calc (NIH) 96 0 - 99 mg/dL   Chol/HDL Ratio 4.5 (H) 0.0 - 4.4 ratio  TSH  Result Value Ref Range   TSH 3.060 0.450 - 4.500 uIU/mL  Urinalysis, Routine w reflex microscopic  Result Value Ref Range   Specific Gravity, UA 1.020 1.005 - 1.030   pH, UA 7.5 5.0 - 7.5   Color, UA Yellow Yellow   Appearance Ur Clear Clear   Leukocytes,UA Trace (A) Negative   Protein,UA Negative Negative/Trace   Glucose, UA Negative Negative   Ketones, UA Negative Negative   RBC, UA Negative Negative   Bilirubin, UA Negative Negative   Urobilinogen, Ur 0.2 0.2 - 1.0 mg/dL   Nitrite, UA Negative Negative   Microscopic Examination See below:       Assessment & Plan:   Problem List Items Addressed This Visit       Other   Vitamin D deficiency   Bipolar 1 disorder (HCC)   Other Visit Diagnoses     Annual physical exam    -  Primary   Screening for ischemic heart disease            Follow up plan: No follow-ups on file.   LABORATORY TESTING:  - Pap smear: not applicable  IMMUNIZATIONS:   - Tdap: Tetanus vaccination status reviewed: last tetanus booster within 10 years. - Influenza:  Administered today - Pneumovax: Not applicable - Prevnar: Not applicable - COVID:  Not applicable - HPV: up to date - Shingrix vaccine: Not applicable  SCREENING: -Mammogram: Not applicable  - Colonoscopy: Not applicable  - Bone Density: Not applicable  -Hearing Test: Not applicable  -Spirometry: Not applicable   PATIENT COUNSELING:   Advised to take 1 mg of folate supplement per day if capable of pregnancy.   Sexuality: Discussed sexually transmitted diseases, partner selection, use of condoms, avoidance of unintended pregnancy  and contraceptive alternatives.   Advised to avoid cigarette smoking.  I discussed with the patient that most people either abstain from alcohol or drink within safe limits (<=14/week and <=4 drinks/occasion for males, <=7/weeks and <= 3 drinks/occasion for females) and that the risk for alcohol disorders and other health effects rises proportionally with the number of drinks per week and how often a drinker exceeds daily limits.  Discussed cessation/primary prevention of drug use and availability of treatment for abuse.   Diet: Encouraged to adjust caloric intake to maintain  or achieve ideal body weight, to reduce intake of dietary saturated fat and total fat, to limit sodium intake by avoiding high sodium foods and not adding table salt, and to maintain adequate dietary potassium and calcium preferably from fresh fruits, vegetables, and low-fat dairy products.    stressed the importance of regular exercise  Injury prevention: Discussed safety belts, safety helmets, smoke detector, smoking near bedding or upholstery.   Dental health: Discussed importance of regular tooth brushing, flossing, and dental visits.    NEXT PREVENTATIVE PHYSICAL DUE IN 1 YEAR. No follow-ups on file.

## 2023-02-13 ENCOUNTER — Ambulatory Visit (INDEPENDENT_AMBULATORY_CARE_PROVIDER_SITE_OTHER): Payer: MEDICAID | Admitting: Nurse Practitioner

## 2023-02-13 ENCOUNTER — Other Ambulatory Visit (HOSPITAL_COMMUNITY)
Admission: RE | Admit: 2023-02-13 | Discharge: 2023-02-13 | Disposition: A | Discharge status: Home / Self Care | Payer: MEDICAID | Source: Ambulatory Visit | Attending: Nurse Practitioner | Admitting: Nurse Practitioner

## 2023-02-13 ENCOUNTER — Encounter: Payer: Self-pay | Admitting: Nurse Practitioner

## 2023-02-13 VITALS — BP 115/63 | HR 81 | Temp 98.1°F | Ht 63.5 in | Wt 155.4 lb

## 2023-02-13 DIAGNOSIS — Z0000 Encounter for general adult medical examination without abnormal findings: Secondary | ICD-10-CM | POA: Insufficient documentation

## 2023-02-13 DIAGNOSIS — E559 Vitamin D deficiency, unspecified: Secondary | ICD-10-CM

## 2023-02-13 DIAGNOSIS — Z23 Encounter for immunization: Secondary | ICD-10-CM

## 2023-02-13 DIAGNOSIS — Z136 Encounter for screening for cardiovascular disorders: Secondary | ICD-10-CM

## 2023-02-13 DIAGNOSIS — F319 Bipolar disorder, unspecified: Secondary | ICD-10-CM | POA: Diagnosis not present

## 2023-02-13 LAB — MICROSCOPIC EXAMINATION: Bacteria, UA: NONE SEEN

## 2023-02-13 LAB — URINALYSIS, ROUTINE W REFLEX MICROSCOPIC
Bilirubin, UA: NEGATIVE
Glucose, UA: NEGATIVE
Ketones, UA: NEGATIVE
Leukocytes,UA: NEGATIVE
Nitrite, UA: NEGATIVE
RBC, UA: NEGATIVE
Specific Gravity, UA: 1.015 (ref 1.005–1.030)
Urobilinogen, Ur: 0.2 mg/dL (ref 0.2–1.0)
pH, UA: 8.5 — ABNORMAL HIGH (ref 5.0–7.5)

## 2023-02-13 NOTE — Assessment & Plan Note (Addendum)
Chronic.  Controlled.  Continue with current medication regimen on Lithium and Lybalvi.   Lithium level checked at visit today.  Followed by Psychiatry.  Also seeing a therapist for Anxiety.  Return to clinic in 6 months for reevaluation.  Call sooner if concerns arise.

## 2023-02-13 NOTE — Assessment & Plan Note (Signed)
Labs ordered at visit today.  Will make recommendations based on lab results.   

## 2023-02-14 ENCOUNTER — Encounter: Payer: Self-pay | Admitting: Nurse Practitioner

## 2023-02-14 LAB — CBC WITH DIFFERENTIAL/PLATELET
Basophils Absolute: 0 10*3/uL (ref 0.0–0.2)
Basos: 0 %
EOS (ABSOLUTE): 0.1 10*3/uL (ref 0.0–0.4)
Eos: 2 %
Hematocrit: 40 % (ref 34.0–46.6)
Hemoglobin: 12.9 g/dL (ref 11.1–15.9)
Immature Grans (Abs): 0 10*3/uL (ref 0.0–0.1)
Immature Granulocytes: 1 %
Lymphocytes Absolute: 1.5 10*3/uL (ref 0.7–3.1)
Lymphs: 22 %
MCH: 29.8 pg (ref 26.6–33.0)
MCHC: 32.3 g/dL (ref 31.5–35.7)
MCV: 92 fL (ref 79–97)
Monocytes Absolute: 0.5 10*3/uL (ref 0.1–0.9)
Monocytes: 7 %
Neutrophils Absolute: 5 10*3/uL (ref 1.4–7.0)
Neutrophils: 68 %
Platelets: 282 10*3/uL (ref 150–450)
RBC: 4.33 x10E6/uL (ref 3.77–5.28)
RDW: 12.2 % (ref 11.7–15.4)
WBC: 7.1 10*3/uL (ref 3.4–10.8)

## 2023-02-14 LAB — COMPREHENSIVE METABOLIC PANEL
ALT: 11 [IU]/L (ref 0–32)
AST: 13 [IU]/L (ref 0–40)
Albumin: 4.7 g/dL (ref 4.0–5.0)
Alkaline Phosphatase: 103 [IU]/L (ref 44–121)
BUN/Creatinine Ratio: 13 (ref 9–23)
BUN: 11 mg/dL (ref 6–20)
Bilirubin Total: 0.3 mg/dL (ref 0.0–1.2)
CO2: 17 mmol/L — ABNORMAL LOW (ref 20–29)
Calcium: 9.9 mg/dL (ref 8.7–10.2)
Chloride: 106 mmol/L (ref 96–106)
Creatinine, Ser: 0.86 mg/dL (ref 0.57–1.00)
Globulin, Total: 2.7 g/dL (ref 1.5–4.5)
Glucose: 94 mg/dL (ref 70–99)
Potassium: 4.1 mmol/L (ref 3.5–5.2)
Sodium: 141 mmol/L (ref 134–144)
Total Protein: 7.4 g/dL (ref 6.0–8.5)
eGFR: 99 mL/min/{1.73_m2} (ref >59)

## 2023-02-14 LAB — LIPID PANEL
Chol/HDL Ratio: 4.8 ratio — ABNORMAL HIGH (ref 0.0–4.4)
Cholesterol, Total: 160 mg/dL (ref 100–199)
HDL: 33 mg/dL — ABNORMAL LOW (ref >39)
LDL Chol Calc (NIH): 106 mg/dL — ABNORMAL HIGH (ref 0–99)
Triglycerides: 114 mg/dL (ref 0–149)
VLDL Cholesterol Cal: 21 mg/dL (ref 5–40)

## 2023-02-14 LAB — VITAMIN D 25 HYDROXY (VIT D DEFICIENCY, FRACTURES): Vit D, 25-Hydroxy: 28.2 ng/mL — ABNORMAL LOW (ref 30.0–100.0)

## 2023-02-14 LAB — LITHIUM LEVEL: Lithium Lvl: 1.2 mmol/L (ref 0.5–1.2)

## 2023-02-14 LAB — TSH: TSH: 1.77 u[IU]/mL (ref 0.450–4.500)

## 2023-02-16 LAB — GC/CHLAMYDIA PROBE AMP
Chlamydia trachomatis, NAA: NEGATIVE
Neisseria Gonorrhoeae by PCR: NEGATIVE

## 2023-02-17 LAB — CYTOLOGY - PAP: Diagnosis: NEGATIVE

## 2023-02-28 ENCOUNTER — Telehealth (INDEPENDENT_AMBULATORY_CARE_PROVIDER_SITE_OTHER): Payer: MEDICAID | Admitting: Pediatrics

## 2023-02-28 ENCOUNTER — Encounter: Payer: Self-pay | Admitting: Pediatrics

## 2023-02-28 DIAGNOSIS — Z3042 Encounter for surveillance of injectable contraceptive: Secondary | ICD-10-CM | POA: Diagnosis not present

## 2023-02-28 DIAGNOSIS — F418 Other specified anxiety disorders: Secondary | ICD-10-CM | POA: Diagnosis not present

## 2023-02-28 DIAGNOSIS — Z3009 Encounter for other general counseling and advice on contraception: Secondary | ICD-10-CM

## 2023-02-28 NOTE — Progress Notes (Unsigned)
   Telehealth Visit  I connected with  Kelsey Aguirre on 03/02/23 by a video enabled telemedicine application and verified that I am speaking with the correct person using two identifiers.   I discussed the limitations of evaluation and management by telemedicine. The patient expressed understanding and agreed to proceed.  Subjective:    Patient ID: Kelsey Aguirre, female    DOB: 2000/12/06, 22 y.o.   MRN: [id number]  HPI: Kelsey Aguirre is a 22 y.o. female  Chief Complaint  Patient presents with   Contraception    Patient says she is wanting to discuss different IUD options. Patient says she has heard a lot about the Copper IUD (ParaGard). Patient states she is currently on Depo Injection and has been on that medication for 2 years.    #Contraception  Patient presenting to discuss IUD options Currently on depo shots, interested in paraguard vs mirena Recently had first pap, some nervousness around that  Relevant past medical, surgical, family and social history reviewed and updated as indicated. Interim medical history since our last visit reviewed. Allergies and medications reviewed and updated.  ROS per HPI unless specifically indicated above     Objective:    LMP  (LMP Unknown)   Wt Readings from Last 3 Encounters:  02/13/23 155 lb 6.4 oz (70.5 kg)  09/22/22 165 lb (74.8 kg)  07/13/22 173 lb 12.8 oz (78.8 kg)     Physical Exam Constitutional:      General: She is not in acute distress.    Appearance: Normal appearance.  Neurological:     General: No focal deficit present.     Mental Status: She is alert. Mental status is at baseline.      LIMITED EXAM GIVEN VIDEO VISIT     Assessment & Plan:  Assessment & Plan   Encounter for counseling regarding contraception Depo-Provera contraceptive status Situational anxiety Discussed different contraception options available and expected effects afterwards. Provided information on pre-procedural counseling  for pain and anxiety control (situational anxiety during procedure). Patient cleared to schedule procedure when she is ready. Will follow up to schedule in the next few months after doing more research.  Follow up plan: Return when ready for procedure.  Overton Boggus P Esthefany Herrig, MD   This visit was completed via video visit through MyChart due to the restrictions of the COVID-19 pandemic. All issues as above were discussed and addressed. Physical exam was done as above through visual confirmation on video through MyChart. If it was felt that the patient should be evaluated in the office, they were directed there. The patient verbally consented to this visit."} Location of the patient: home Location of the provider: work Those involved with this call:  Provider: Modena Nunnery, MD CMA: Malen Gauze, CMA Time spent on call:  10 minutes on the phone discussing health concerns. 10 minutes total spent in review of patient's record and preparation of their chart.

## 2023-03-01 ENCOUNTER — Ambulatory Visit (INDEPENDENT_AMBULATORY_CARE_PROVIDER_SITE_OTHER): Payer: MEDICAID

## 2023-03-01 ENCOUNTER — Ambulatory Visit: Payer: MEDICAID

## 2023-03-01 DIAGNOSIS — Z3042 Encounter for surveillance of injectable contraceptive: Secondary | ICD-10-CM

## 2023-03-01 MED ORDER — MEDROXYPROGESTERONE ACETATE 150 MG/ML IM SUSP
150.0000 mg | Freq: Once | INTRAMUSCULAR | Status: AC
  Administered 2023-03-01: 150 mg via INTRAMUSCULAR

## 2023-03-02 ENCOUNTER — Encounter: Payer: Self-pay | Admitting: Pediatrics

## 2023-03-02 NOTE — Patient Instructions (Signed)
BEFORE your procedure: - ibuprofen 600-800mg  30 minutes before procedure - Please take anti-anxiety medication (typically valium 5mg ) 10 minutes before your appointment. Bring an extra one just in case  AT THE VISIT: - We will give you a tampon to place with numbing medication for at least 10 minutes   Feel free to bring 1 support person the day of your procedure  If you have any questions, please do not hesitate to mychart message me ahead of the procedure.  Check out the website bedsiders.[email or web address] to compare different birth control options

## 2023-05-24 ENCOUNTER — Telehealth: Payer: Self-pay | Admitting: Nurse Practitioner

## 2023-05-24 NOTE — Telephone Encounter (Signed)
Copied from CRM [id number]. Topic: General - Other >> May 24, 2023 12:00 PM Priscille Loveless wrote: Reason for CRM: Pt is wanting to schedule an appt to get her IUD. I looked in OneNote and it says we dont do IUD's but she said that she had a consult with Dr Evelene Croon. Please call her and advise.

## 2023-05-24 NOTE — Telephone Encounter (Unsigned)
Copied from CRM [id number]. Topic: General - Other >> May 24, 2023 12:00 PM Priscille Loveless wrote: Reason for CRM: Pt is wanting to schedule an appt to get her IUD. I looked in OneNote and it says we dont do IUD's but she said that she had a consult with Dr Evelene Croon. Please call her and advise.

## 2023-05-25 NOTE — Telephone Encounter (Signed)
Appointment has been made

## 2023-05-29 ENCOUNTER — Ambulatory Visit: Payer: MEDICAID

## 2023-05-29 DIAGNOSIS — Z3042 Encounter for surveillance of injectable contraceptive: Secondary | ICD-10-CM | POA: Diagnosis not present

## 2023-05-29 MED ORDER — MEDROXYPROGESTERONE ACETATE 150 MG/ML IM SUSP
150.0000 mg | Freq: Once | INTRAMUSCULAR | Status: AC
  Administered 2023-05-29: 150 mg via INTRAMUSCULAR

## 2023-05-29 NOTE — Progress Notes (Signed)
 Patient is in office today for a nurse visit for Birth Control Injection. Patient Injection was given in the  Left deltoid. Patient tolerated injection well.

## 2023-06-14 ENCOUNTER — Telehealth: Payer: Self-pay | Admitting: Nurse Practitioner

## 2023-06-14 NOTE — Telephone Encounter (Signed)
 Copied from CRM [id number]. Topic: Clinical - Medication Question >> Jun 14, 2023 12:57 PM Kelsey Aguirre wrote: Patient states that provider was going to send in an rx for Tramadol for her to take for her IUD insertion on 3/14. No rx is showing in chart. Patient also states that she no longer needs an rx for Valium. Patient also stated that she does not want the Paraguard IUD, she wants the Mirena.  Please advise

## 2023-06-14 NOTE — Telephone Encounter (Signed)
 Routing to PCP and provider doing the procedure.

## 2023-06-15 ENCOUNTER — Other Ambulatory Visit: Payer: Self-pay | Admitting: Pediatrics

## 2023-06-15 DIAGNOSIS — Z3009 Encounter for other general counseling and advice on contraception: Secondary | ICD-10-CM

## 2023-06-15 MED ORDER — TRAMADOL HCL 50 MG PO TABS: 100.0000 mg | ORAL_TABLET | ORAL | 0 refills | Status: DC | PRN

## 2023-06-15 NOTE — Progress Notes (Signed)
 Sent 4 tabs tramadol for IUD insertion.  Jackolyn Confer, MD

## 2023-06-15 NOTE — Telephone Encounter (Signed)
 Noted and appt description updated.

## 2023-06-16 ENCOUNTER — Telehealth: Payer: Self-pay | Admitting: Nurse Practitioner

## 2023-06-16 NOTE — Telephone Encounter (Signed)
 Copied from CRM [id number]. Topic: Clinical - Medication Question >> Jun 16, 2023  9:18 AM Haroldine Laws wrote: Reason for CRM: pt has been prescribed Tramadol for her IUD procedure but she said the pharmacy told her about side effects and she doesn't like them so she want to be prescribed something else .  Please advise...[phone]

## 2023-06-16 NOTE — Telephone Encounter (Signed)
 Copied from CRM [id number]. Topic: Clinical - Medication Question >> Jun 16, 2023  9:18 AM Kelsey Aguirre wrote: Reason for CRM: pt has been prescribed Tramadol for her IUD procedure but she said the pharmacy told her about side effects and she doesn't like them so she want to be prescribed something else .  Please advise...[phone]

## 2023-06-16 NOTE — Telephone Encounter (Signed)
 Routing to prescribing provider.

## 2023-06-20 NOTE — Telephone Encounter (Signed)
 Called and notified patient of Dr. Carie Caddy message. Patient states that she wants to know what Dr. Evelene Croon thinks about her taking the tramadol with the other medications she is on.

## 2023-06-21 NOTE — Telephone Encounter (Signed)
 See mychart messages between provider and patient regarding this.

## 2023-06-23 ENCOUNTER — Ambulatory Visit: Payer: MEDICAID | Admitting: Pediatrics

## 2023-06-23 ENCOUNTER — Encounter: Payer: Self-pay | Admitting: Pediatrics

## 2023-06-23 VITALS — Ht 63.5 in | Wt 156.2 lb

## 2023-06-23 DIAGNOSIS — Z975 Presence of (intrauterine) contraceptive device: Secondary | ICD-10-CM

## 2023-06-23 DIAGNOSIS — Z30430 Encounter for insertion of intrauterine contraceptive device: Secondary | ICD-10-CM

## 2023-06-23 LAB — PREGNANCY, URINE: Preg Test, Ur: NEGATIVE

## 2023-06-23 NOTE — Progress Notes (Signed)
 IUD Insertion Procedure Note  Kelsey Aguirre is a 23 y.o. female presenting for IUD insertion.   Indication: contraception  Preparation:  Urine pregnancy test result: negative Pain management prior to visit: tylenol 100mg  30 min before Anxiety: declined EMLA coated tampon for prior to procedure: yes  The risks, benefits, and alternatives to the procedure were discussed in detail and printed informed consent was obtained. Sent to scanning center.  MA/RN present during procedure.  Procedure Details:  Bimanual exam was performed and the position of the uterus was noted to be anteverted, facing perpendicular with speculum placement. Speculum was placed in vagina and adequate visualization of cervix was achieved. The cervix was cleaned with betadine x 3.  A single toothed tenaculum was not needed. An endometrial pipelle was passed into the uterine cavity and the uterus was sounded to 7 cm.   The intrauterine device was then inserted sterilely into the uterine cavity to the level of the upper uterine cavity and the introducer was removed.  The IUD strings were cut 2 cm from the external os of the cervix.  Vaginal ultrasound not recommended.  Patient tolerated procedure well.  Complications: none  IUD Type: Mirena, Lot # [id number], Expiration date AUG 2026  Post-procedure: standard post procedure care instructions were given to the patient. Patient aware that removal of IUD is due in 8 years. Routine health maintenance exams recommended.  Patient to follow-up as needed.   Jackolyn Confer, MD

## 2023-06-23 NOTE — Patient Instructions (Signed)
 [facility]    IUD Take-Home Sheet  [ x   ] Hormonal IUD (Mirena, Tracy) It begins working now to prevent pregnancy. It can stay inside you for 8 years. Removal date __3/14/2033___(8 years from today)  Today you may go back to school or work after your visit. Wait 24 hours after your IUD is put in before you can use tampons, take a bath, or have vaginal sex.  You may have more cramps or heavier bleeding with your periods, or spotting between your periods. This is normal. The cramping and bleeding can last for 3-6 months with the Mirena, [place], [place], and Skyla (hormone) IUDs. After 6 months, the cramping and bleeding should get better. Many people will stop having periods after 1 or 2 years with the Mirena, Huey, [place], and Skyla (hormone) IUDs. If you have the Paragard (copper) IUD, you may have more cramping and more bleeding with your periods as long as you have the IUD inside you.  Ibuprofen (also called Advil or Motrin) helps decrease the bleeding and cramping. You can buy Ibuprofen at any drug store without a prescription. You can take as many as 4 pills (800 mg) of Ibuprofen every 8 hours with food (each pill contains 200 mg). To prevent cramping, start taking Ibuprofen as soon as your period starts and keep taking it every 8 hours for the first 2-3 days of your period. You can also put a hot water bottle on your belly if you have bad cramps.   Your IUD may come out by itself in the first three months. If you can feel the strings, the IUD is in the right place. If your IUD comes out, you can become pregnant immediately. If you are not sure how to check the strings, we can help you (Our phone number is at the top of this paper.) Meanwhile, use condoms.   The IUD does NOT protect you against sexually transmitted infections. ALWAYS use protection against sexually transmitted infections (external condoms, internal condoms, dental dams) if you are at risk. If  you think you have been exposed to an STI, discuss testing with your clinician. Most infections can be treated WITHOUT removing your IUD.  WARNING SIGNS: Within the first 3 weeks of the IUD insertion: - Fever (>101F) - Chills - Strong or sharp pain in your stomach or belly At Any Time (these are very rare): - Late period (for Paragard users) - Feeling pregnant (breast tenderness, nausea, vomiting) - Positive home pregnancy test    If you develop any of the above warning signs, you can call this clinic at [phone], or see your usual clinician.

## 2023-07-13 ENCOUNTER — Other Ambulatory Visit: Payer: Self-pay | Admitting: Nurse Practitioner

## 2023-07-14 NOTE — Telephone Encounter (Signed)
 Requested Prescriptions  Pending Prescriptions Disp Refills   fluticasone (FLONASE) 50 MCG/ACT nasal spray [Pharmacy Med Name: FLUTICASONE NASAL SP (120) RX] 48 g 6    Sig: SHAKE LIQUID AND USE 1 SPRAY IN EACH NOSTRIL TWICE DAILY     Ear, Nose, and Throat: Nasal Preparations - Corticosteroids Failed - 07/14/2023 10:32 AM      Failed - Valid encounter within last 12 months    Recent Outpatient Visits           3 weeks ago IUD (intrauterine device) in place   [facility] [place], Atilano Median, MD       Future Appointments             In 1 month Larae Grooms, NP [facility]

## 2023-08-14 ENCOUNTER — Encounter: Payer: Self-pay | Admitting: Nurse Practitioner

## 2023-08-14 ENCOUNTER — Ambulatory Visit (INDEPENDENT_AMBULATORY_CARE_PROVIDER_SITE_OTHER): Payer: MEDICAID | Admitting: Nurse Practitioner

## 2023-08-14 VITALS — BP 107/69 | HR 77 | Temp 97.6°F | Wt 157.0 lb

## 2023-08-14 DIAGNOSIS — Z1330 Encounter for screening examination for mental health and behavioral disorders, unspecified: Secondary | ICD-10-CM | POA: Diagnosis not present

## 2023-08-14 DIAGNOSIS — F319 Bipolar disorder, unspecified: Secondary | ICD-10-CM | POA: Diagnosis not present

## 2023-08-14 NOTE — Progress Notes (Addendum)
 BP 107/69   Pulse 77   Temp 97.6 F (36.4 C) (Oral)   Wt 157 lb (71.2 kg)   LMP  (LMP Unknown)   SpO2 97%   BMI 27.38 kg/m    Subjective:    Patient ID: Kelsey Aguirre, female    DOB: [birth year], 23 y.o.   MRN: [id number]  HPI: Kelsey Aguirre is a 23 y.o. female  Chief Complaint  Patient presents with   Medication Management   MOOD Patient states she is doing well.  She is now taking Lybalvi and Lithium .  She is also now taking Adderall which was really helping with school.  She was struggling a lot in class with focus and that helped her symptoms a lot.  She is also seeing a therapist for anxiety.  Denies SI.     08/14/2023    9:34 AM 02/13/2023    9:49 AM 07/13/2022    8:10 AM  PHQ9 SCORE ONLY  PHQ-9 Total Score 4 2 6       08/14/2023    9:35 AM 02/13/2023    9:49 AM 07/13/2022    8:11 AM 01/11/2022    9:52 AM  GAD 7 : Generalized Anxiety Score  Nervous, Anxious, on Edge 0 2 0 0  Control/stop worrying 0 3 0 0  Worry too much - different things 0 1 0 0  Trouble relaxing 0 0 0 0  Restless 0 0 0 0  Easily annoyed or irritable 0 0 0 1  Afraid - awful might happen 0 2 0 0  Total GAD 7 Score 0 8 0 1  Anxiety Difficulty Not difficult at all Somewhat difficult Somewhat difficult Not difficult at all      Relevant past medical, surgical, family and social history reviewed and updated as indicated. Interim medical history since our last visit reviewed. Allergies and medications reviewed and updated.  Review of Systems  Psychiatric/Behavioral:  Positive for dysphoric mood. Negative for suicidal ideas. The patient is nervous/anxious.     Per HPI unless specifically indicated above     Objective:    BP 107/69   Pulse 77   Temp 97.6 F (36.4 C) (Oral)   Wt 157 lb (71.2 kg)   LMP  (LMP Unknown)   SpO2 97%   BMI 27.38 kg/m   Wt Readings from Last 3 Encounters:  08/14/23 157 lb (71.2 kg)  06/23/23 156 lb 3.2 oz (70.9 kg)  02/13/23 155 lb 6.4 oz (70.5 kg)     Physical Exam Vitals and nursing note reviewed.  Constitutional:      General: She is not in acute distress.    Appearance: Normal appearance. She is normal weight. She is not ill-appearing, toxic-appearing or diaphoretic.  HENT:     Head: Normocephalic.     Right Ear: External ear normal.     Left Ear: External ear normal.     Nose: Nose normal.     Mouth/Throat:     Mouth: Mucous membranes are moist.     Pharynx: Oropharynx is clear.  Eyes:     General:        Right eye: No discharge.        Left eye: No discharge.     Extraocular Movements: Extraocular movements intact.     Conjunctiva/sclera: Conjunctivae normal.     Pupils: Pupils are equal, round, and reactive to light.  Cardiovascular:     Rate and Rhythm: Normal rate and regular rhythm.  Heart sounds: No murmur heard. Pulmonary:     Effort: Pulmonary effort is normal. No respiratory distress.     Breath sounds: Normal breath sounds. No wheezing or rales.  Musculoskeletal:     Cervical back: Normal range of motion and neck supple.  Skin:    General: Skin is warm and dry.     Capillary Refill: Capillary refill takes less than 2 seconds.  Neurological:     General: No focal deficit present.     Mental Status: She is alert and oriented to person, place, and time. Mental status is at baseline.  Psychiatric:        Mood and Affect: Mood normal.        Behavior: Behavior normal.        Thought Content: Thought content normal.        Judgment: Judgment normal.     Results for orders placed or performed in visit on 06/23/23  Pregnancy, urine   Collection Time: 06/23/23  3:48 PM  Result Value Ref Range   Preg Test, Ur Negative Negative      Assessment & Plan:   Problem List Items Addressed This Visit       Other   Bipolar 1 disorder (HCC) - Primary   Chronic.  Controlled.  Continue with current medication regimen on Lithium  and Lybalvi.   Now on Adderall also.  Followed by Psychiatry.  Also seeing a  therapist for Anxiety.  Return to clinic in 6 months for reevaluation.  Call sooner if concerns arise.         Follow up plan: Return in about 6 months (around 02/14/2024) for Physical and Fasting labs.

## 2023-08-14 NOTE — Addendum Note (Signed)
 Addended by: Aileen Alexanders on: 08/14/2023 11:47 AM   Modules accepted: Level of Service

## 2023-08-14 NOTE — Assessment & Plan Note (Signed)
 Chronic.  Controlled.  Continue with current medication regimen on Lithium  and Lybalvi.   Now on Adderall also.  Followed by Psychiatry.  Also seeing a therapist for Anxiety.  Return to clinic in 6 months for reevaluation.  Call sooner if concerns arise.

## 2023-09-13 ENCOUNTER — Encounter: Payer: Self-pay | Admitting: Nurse Practitioner

## 2023-09-19 IMAGING — MR MR HEAD WO/W CM
14 series · 48 of 48 positions shown · IV contrast (gadavist)
Comparison: 04/07/2020

CLINICAL DATA: White matter abnormality on brain MRI.

EXAM:
MRI HEAD WITHOUT AND WITH CONTRAST
TECHNIQUE: Multiplanar, multiecho pulse sequences of the brain and surrounding
structures were obtained without and with intravenous contrast.
CONTRAST:  7mL GADAVIST GADOBUTROL 1 MMOL/ML IV SOLN

[Series 5: ax dwi_tracew · axial · 3.0mm · 0.65mm/px · z∈[-79,+75]mm · 4 of 48 slices shown]
[im 1/48]
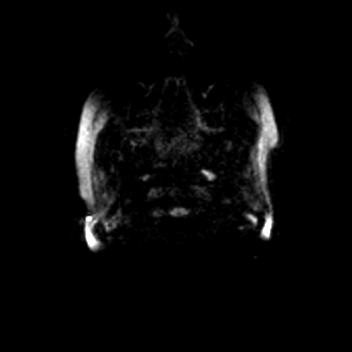
[im 16/48]
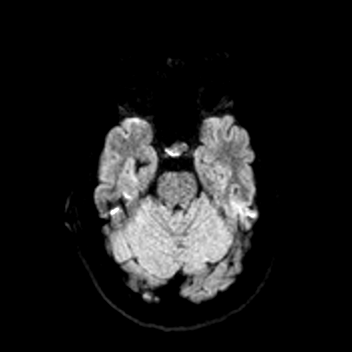
[im 32/48]
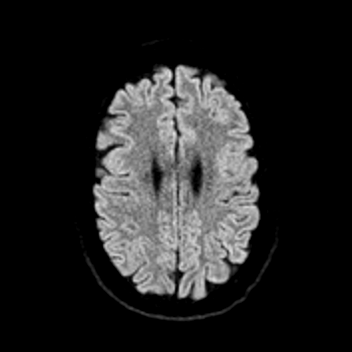
[im 48/48]
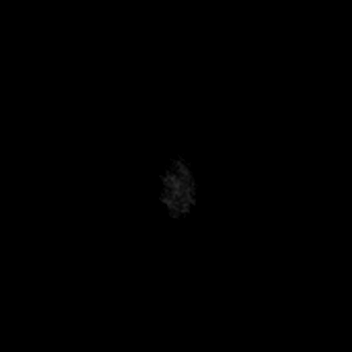

[Series 6: ax dwi_adc · axial · 3.0mm · 0.65mm/px · z∈[-79,+75]mm · 3 of 48 slices shown]
[im 1/48]
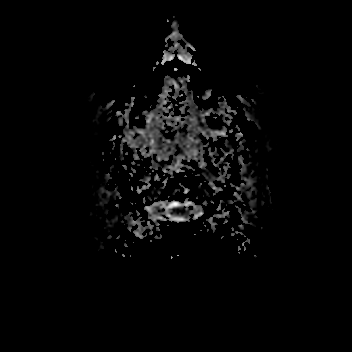
[im 24/48]
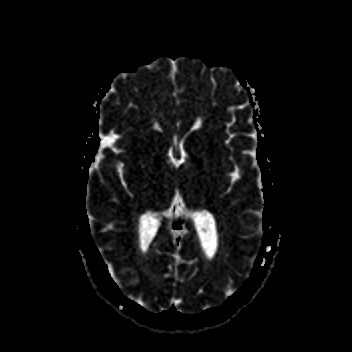
[im 48/48]
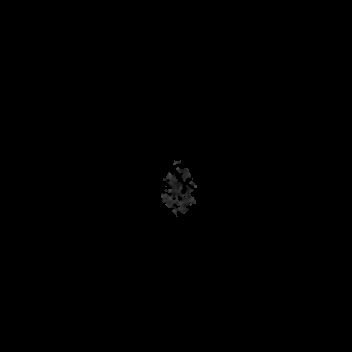

[Series 7: cor dwi_tracew · coronal · 5.0mm · 0.65mm/px · 2 of 40 slices shown]
[im 1/40]
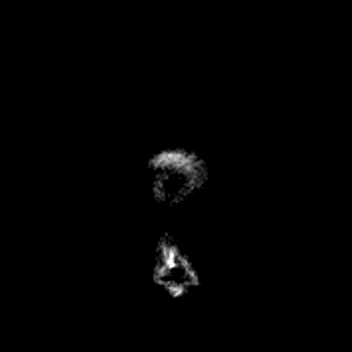
[im 40/40]
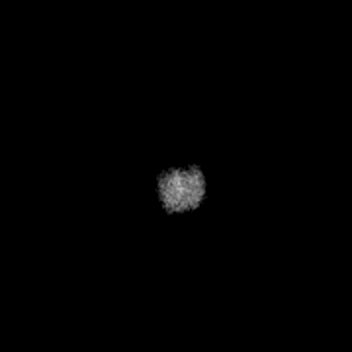

[Series 8: cor dwi_adc · coronal · 5.0mm · 0.65mm/px · 2 of 39 slices shown]
[im 1/39]
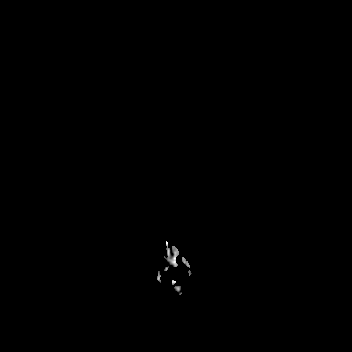
[im 39/39]
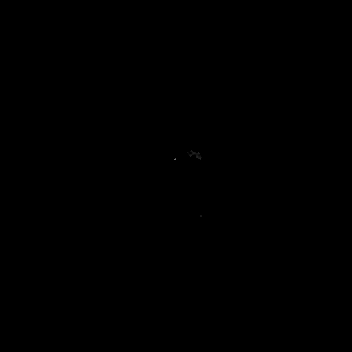

[Series 9: T1 · sagittal · 5.0mm · 0.62mm/px · 1 of 25 slices shown (1 of 2)]
[im 1/25]
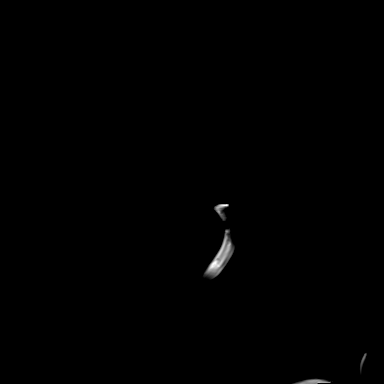

[Series 10: T2 · axial · 5.0mm · 0.53mm/px · z∈[-82,+74]mm · 2 of 27 slices shown]
[im 1/27]
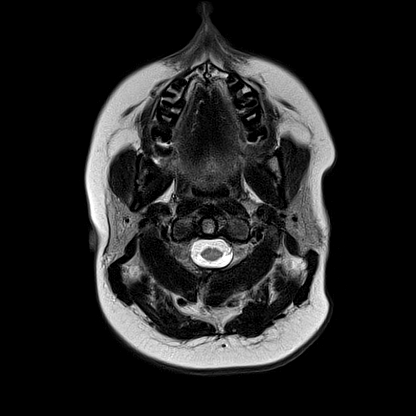
[im 27/27]
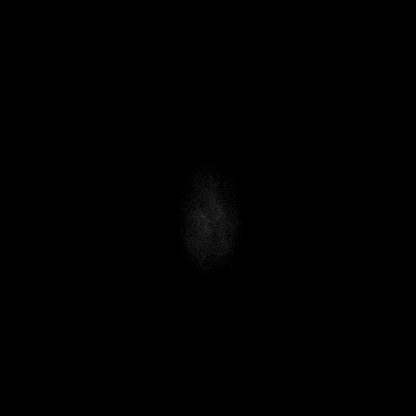

[Series 12: pha_images · axial · 3.0mm · 0.90mm/px · z∈[-93,+78]mm · 3 of 58 slices shown]
[im 1/58]
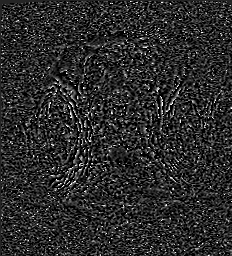
[im 29/58]
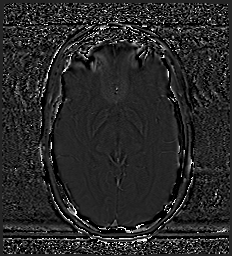
[im 58/58]
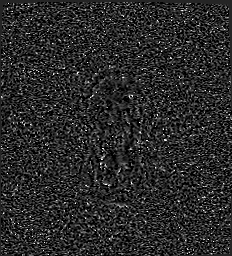

[Series 13: swi_images · axial · 3.0mm · 0.90mm/px · z∈[-93,+84]mm · 3 of 60 slices shown]
[im 1/60]
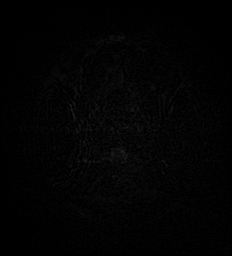
[im 30/60]
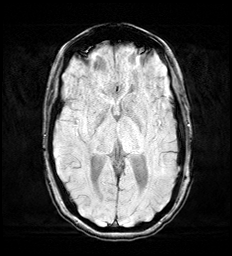
[im 60/60]
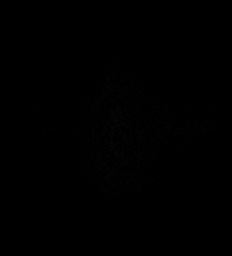

[Series 15: FLAIR · axial · 3.0mm · 0.53mm/px · z∈[-85,+77]mm · 3 of 55 slices shown]
[im 1/55]
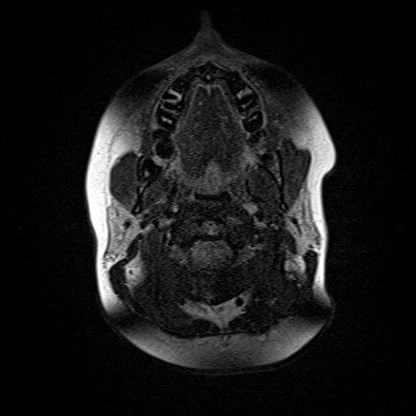
[im 28/55]
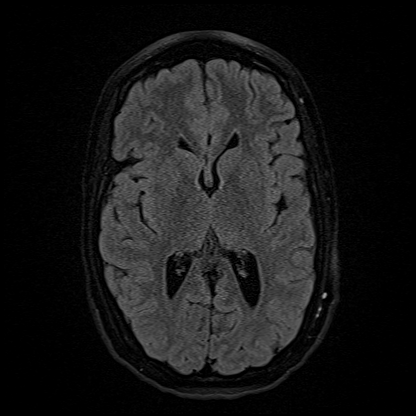
[im 55/55]
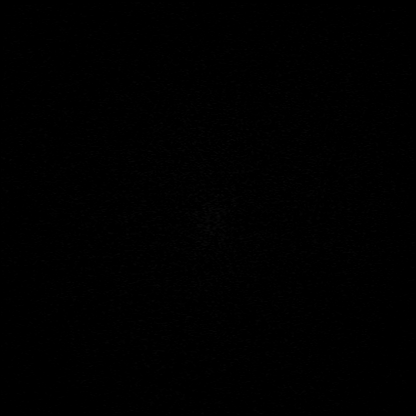

[Series 16: T1 · axial · 1.0mm · 0.98mm/px · z∈[-87,+87]mm · 10 of 176 slices shown (2 of 2)]
[im 1/176]
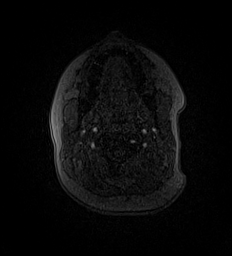
[im 20/176]
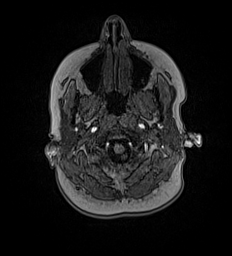
[im 39/176]
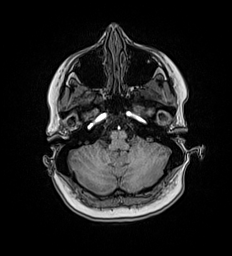
[im 59/176]
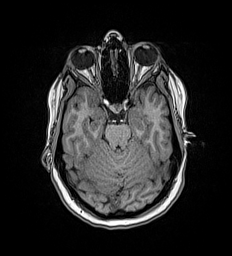
[im 78/176]
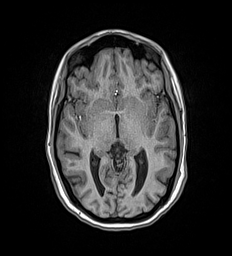
[im 98/176]
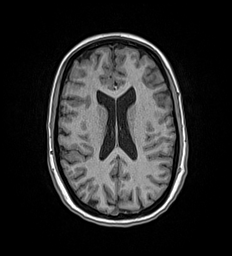
[im 117/176]
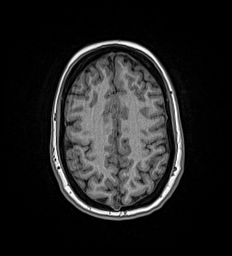
[im 137/176]
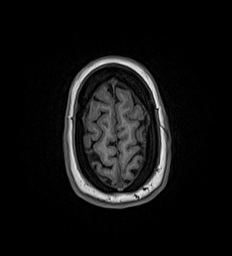
[im 156/176]
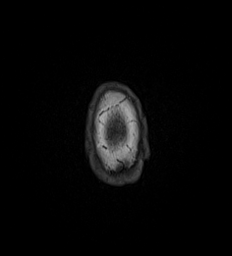
[im 176/176]
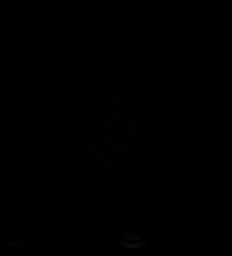

[Series 17: T2 post-contrast · coronal · 5.0mm · 0.57mm/px · 2 of 29 slices shown]
[im 1/29]
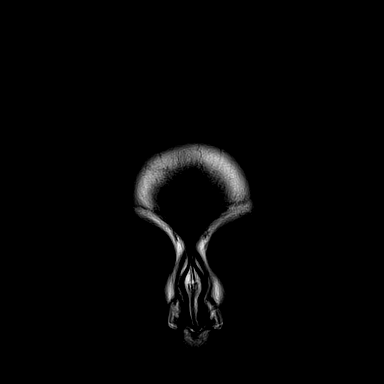
[im 29/29]
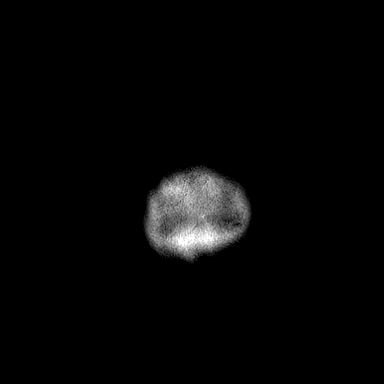

[Series 18: T1 post-contrast · axial · 1.0mm · 0.98mm/px · z∈[-87,+87]mm · 10 of 176 slices shown (1 of 3)]
[im 1/176]
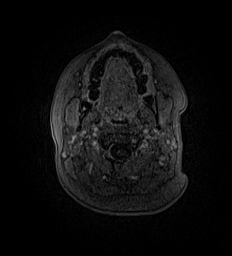
[im 20/176]
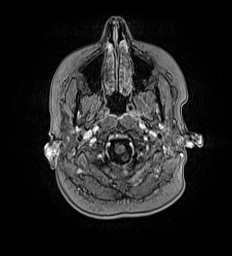
[im 39/176]
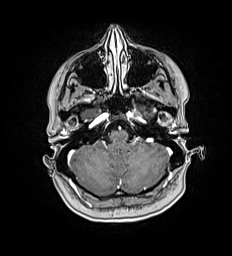
[im 59/176]
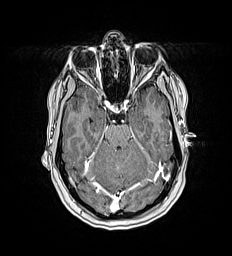
[im 78/176]
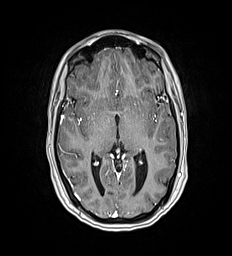
[im 98/176]
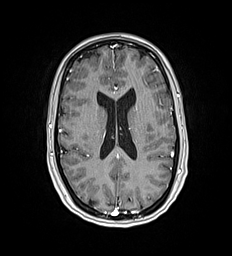
[im 117/176]
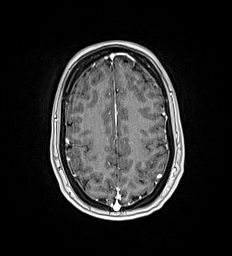
[im 137/176]
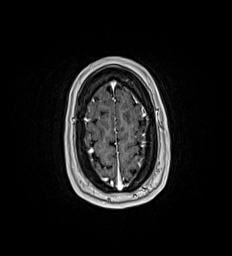
[im 156/176]
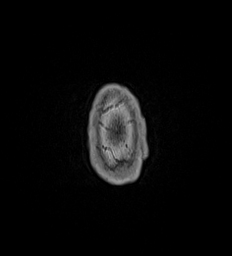
[im 176/176]
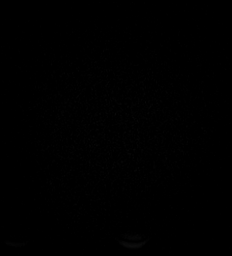

[Series 19: T1 post-contrast · coronal · 5.0mm · 0.57mm/px · 2 of 29 slices shown (2 of 3)]
[im 1/29]
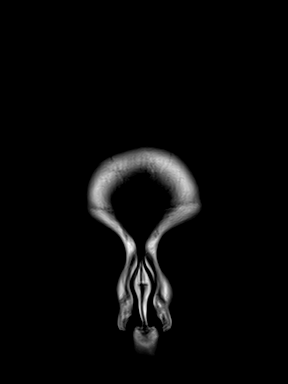
[im 29/29]
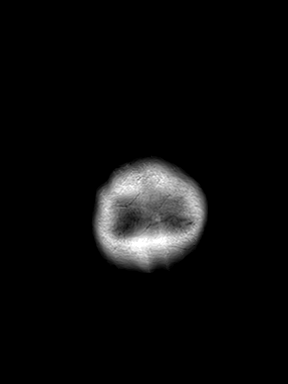

[Series 20: T1 post-contrast · sagittal · 5.0mm · 0.62mm/px · 1 of 25 slices shown (3 of 3)]
[im 1/25]
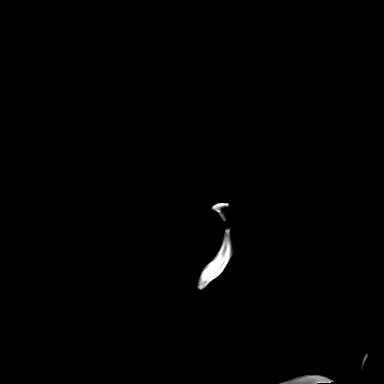

[48 of 48 positions shown; findings below may reference images not displayed]

FINDINGS: Brain: Between 6 and 10 small areas of FLAIR hyperintensity in the
cerebral white matter are non progressed. As noted previously these
could be from prior ischemia, infection, demyelination, or trauma.
No specific demyelinating pattern or associated chronic blood
products. Brain volume is normal. No acute or subacute infarct,
hemorrhage, hydrocephalus, or abnormal enhancement.

Vascular: Normal flow voids and vascular enhancements

Skull and upper cervical spine: Normal marrow signal

Sinuses/Orbits: Negative
IMPRESSION: Reassuring stability of a few remote white matter insults when
compared to 5259. The pattern of these remain nonspecific. No new or
progressive finding.

## 2024-02-19 ENCOUNTER — Ambulatory Visit: Payer: MEDICAID | Admitting: Nurse Practitioner

## 2024-02-19 NOTE — Progress Notes (Deleted)
 There were no vitals taken for this visit.   Subjective:    Patient ID: Kelsey Aguirre, female    DOB: [birth year], 23 y.o.   MRN: [id number]  HPI: Kelsey Aguirre is a 23 y.o. female presenting on 02/19/2024 for comprehensive medical examination. Current medical complaints include:none  She currently lives with: Menopausal Symptoms: no  MOOD Patient states she is doing well.  She is now taking Lybalvi and Lithium .  She has lost 24lbs since being off the zyprexa.  She is also seeing a therapist for anxiety.  Denies SI.  Depression Screen done today and results listed below:     08/14/2023    9:34 AM 02/13/2023    9:49 AM 07/13/2022    8:10 AM 01/11/2022    9:52 AM 11/03/2021    9:31 AM  Depression screen PHQ 2/9  Decreased Interest 1 0 0 0 0  Down, Depressed, Hopeless 0 0 0 0 0  PHQ - 2 Score 1 0 0 0 0  Altered sleeping 1 0 2 0 1  Tired, decreased energy 1 2 2  0 0  Change in appetite 1 0 2 2 1   Feeling bad or failure about yourself  0 0 0 0 0  Trouble concentrating 0 0 0 0 0  Moving slowly or fidgety/restless 0 0 0 0 0  Suicidal thoughts 0 0 0 0 0  PHQ-9 Score 4  2  6  2  2    Difficult doing work/chores Somewhat difficult Not difficult at all Somewhat difficult Not difficult at all Not difficult at all     Data saved with a previous flowsheet row definition    The patient does not have a history of falls. I did complete a risk assessment for falls. A plan of care for falls was documented.   Past Medical History:  Past Medical History:  Diagnosis Date  . Allergy   . Anxiety   . Asthma   . Bipolar 1 disorder (HCC)   . Depression   . Scoliosis     Surgical History:  Past Surgical History:  Procedure Laterality Date  . TONSILLECTOMY      Medications:  Current Outpatient Medications on File Prior to Visit  Medication Sig  . albuterol  (VENTOLIN  HFA) 108 (90 Base) MCG/ACT inhaler INHALE 2 PUFFS BY MOUTH EVERY 6 HOURS AS NEEDED FOR WHEEZING OR SHORTNESS OF BREATH   . amphetamine-dextroamphetamine (ADDERALL XR) 20 MG 24 hr capsule Take 20 mg by mouth daily.  . fluticasone  (FLONASE ) 50 MCG/ACT nasal spray SHAKE LIQUID AND USE 1 SPRAY IN EACH NOSTRIL TWICE DAILY  . lithium  carbonate 150 MG capsule Take 1 capsule by mouth daily.  . lithium  carbonate 300 MG capsule Take 300 mg by mouth 2 (two) times daily. Take 1 300mg  in the am and 2 300mg  in the pm  . LYBALVI 15-10 MG TABS Take 1 tablet by mouth daily.   No current facility-administered medications on file prior to visit.    Allergies:  Allergies  Allergen Reactions  . Vraylar [Cariprazine Hcl] Nausea And Vomiting  . Zyrtec [Cetirizine] Nausea And Vomiting    Social History:  Social History   Socioeconomic History  . Marital status: Single    Spouse name: Not on file  . Number of children: Not on file  . Years of education: Not on file  . Highest education level: Not on file  Occupational History  . Not on file  Tobacco Use  . Smoking status: Never  .  Smokeless tobacco: Never  Vaping Use  . Vaping status: Never Used  Substance and Sexual Activity  . Alcohol use: No  . Drug use: No  . Sexual activity: Yes    Birth control/protection: Injection  Other Topics Concern  . Not on file  Social History Narrative  . Not on file   Social Drivers of Health   Financial Resource Strain: Not on file  Food Insecurity: Not on file  Transportation Needs: Not on file  Physical Activity: Not on file  Stress: Not on file  Social Connections: Not on file  Intimate Partner Violence: Not on file   Social History   Tobacco Use  Smoking Status Never  Smokeless Tobacco Never   Social History   Substance and Sexual Activity  Alcohol Use No    Family History:  Family History  Problem Relation Age of Onset  . Drug abuse Mother   . Hepatitis C Mother   . Hypertension Mother   . Hepatitis B Father   . HIV Father   . Heart attack Father   . Cancer Maternal Grandmother   . Hypertension  Maternal Grandmother   . Osteoporosis Maternal Grandmother   . Heart disease Maternal Grandfather   . Lung disease Paternal Grandmother     Past medical history, surgical history, medications, allergies, family history and social history reviewed with patient today and changes made to appropriate areas of the chart.   Review of Systems  Eyes:  Negative for blurred vision and double vision.  Respiratory:  Negative for shortness of breath.   Cardiovascular:  Negative for chest pain, palpitations and leg swelling.  Neurological:  Negative for dizziness and headaches.  Psychiatric/Behavioral:  Negative for depression and suicidal ideas. The patient is not nervous/anxious.    All other ROS negative except what is listed above and in the HPI.      Objective:    There were no vitals taken for this visit.  Wt Readings from Last 3 Encounters:  08/14/23 157 lb (71.2 kg)  06/23/23 156 lb 3.2 oz (70.9 kg)  02/13/23 155 lb 6.4 oz (70.5 kg)    Physical Exam Vitals and nursing note reviewed. Exam conducted with a chaperone present Bonney Metro, CMA).  Constitutional:      General: She is awake. She is not in acute distress.    Appearance: Normal appearance. She is well-developed and normal weight. She is not ill-appearing.  HENT:     Head: Normocephalic and atraumatic.     Right Ear: Hearing, tympanic membrane, ear canal and external ear normal. No drainage.     Left Ear: Hearing, tympanic membrane, ear canal and external ear normal. No drainage.     Nose: Nose normal.     Right Sinus: No maxillary sinus tenderness or frontal sinus tenderness.     Left Sinus: No maxillary sinus tenderness or frontal sinus tenderness.     Mouth/Throat:     Mouth: Mucous membranes are moist.     Pharynx: Oropharynx is clear. Uvula midline. No pharyngeal swelling, oropharyngeal exudate or posterior oropharyngeal erythema.  Eyes:     General: Lids are normal.        Right eye: No discharge.        Left  eye: No discharge.     Extraocular Movements: Extraocular movements intact.     Conjunctiva/sclera: Conjunctivae normal.     Pupils: Pupils are equal, round, and reactive to light.     Visual Fields: Right eye visual fields  normal and left eye visual fields normal.  Neck:     Thyroid: No thyromegaly.     Vascular: No carotid bruit.     Trachea: Trachea normal.  Cardiovascular:     Rate and Rhythm: Normal rate and regular rhythm.     Heart sounds: Normal heart sounds. No murmur heard.    No gallop.  Pulmonary:     Effort: Pulmonary effort is normal. No accessory muscle usage or respiratory distress.     Breath sounds: Normal breath sounds.  Chest:  Breasts:    Right: Normal.     Left: Normal.  Abdominal:     General: Bowel sounds are normal.     Palpations: Abdomen is soft. There is no hepatomegaly or splenomegaly.     Tenderness: There is no abdominal tenderness.  Genitourinary:    Vagina: Normal.     Cervix: Normal.     Adnexa: Right adnexa normal and left adnexa normal.  Musculoskeletal:        General: Normal range of motion.     Cervical back: Normal range of motion and neck supple.     Right lower leg: No edema.     Left lower leg: No edema.  Lymphadenopathy:     Head:     Right side of head: No submental, submandibular, tonsillar, preauricular or posterior auricular adenopathy.     Left side of head: No submental, submandibular, tonsillar, preauricular or posterior auricular adenopathy.     Cervical: No cervical adenopathy.     Upper Body:     Right upper body: No supraclavicular, axillary or pectoral adenopathy.     Left upper body: No supraclavicular, axillary or pectoral adenopathy.  Skin:    General: Skin is warm and dry.     Capillary Refill: Capillary refill takes less than 2 seconds.     Findings: No rash.  Neurological:     Mental Status: She is alert and oriented to person, place, and time.     Gait: Gait is intact.     Deep Tendon Reflexes: Reflexes are  normal and symmetric.     Reflex Scores:      Brachioradialis reflexes are 2+ on the right side and 2+ on the left side.      Patellar reflexes are 2+ on the right side and 2+ on the left side. Psychiatric:        Attention and Perception: Attention normal.        Mood and Affect: Mood normal.        Speech: Speech normal.        Behavior: Behavior normal. Behavior is cooperative.        Thought Content: Thought content normal.        Judgment: Judgment normal.     Results for orders placed or performed in visit on 06/23/23  Pregnancy, urine   Collection Time: 06/23/23  3:48 PM  Result Value Ref Range   Preg Test, Ur Negative Negative      Assessment & Plan:   Problem List Items Addressed This Visit       Other   Bipolar 1 disorder (HCC) - Primary     Follow up plan: No follow-ups on file.   LABORATORY TESTING:  - Pap smear: Done today  IMMUNIZATIONS:   - Tdap: Tetanus vaccination status reviewed: last tetanus booster within 10 years. - Influenza: Administered today - Pneumovax: Not applicable - Prevnar: Not applicable - COVID: Not applicable - HPV: up to date -  Shingrix vaccine: Not applicable  SCREENING: -Mammogram: Not applicable  - Colonoscopy: Not applicable  - Bone Density: Not applicable  -Hearing Test: Not applicable  -Spirometry: Not applicable   PATIENT COUNSELING:   Advised to take 1 mg of folate supplement per day if capable of pregnancy.   Sexuality: Discussed sexually transmitted diseases, partner selection, use of condoms, avoidance of unintended pregnancy  and contraceptive alternatives.   Advised to avoid cigarette smoking.  I discussed with the patient that most people either abstain from alcohol or drink within safe limits (<=14/week and <=4 drinks/occasion for males, <=7/weeks and <= 3 drinks/occasion for females) and that the risk for alcohol disorders and other health effects rises proportionally with the number of drinks per week and  how often a drinker exceeds daily limits.  Discussed cessation/primary prevention of drug use and availability of treatment for abuse.   Diet: Encouraged to adjust caloric intake to maintain  or achieve ideal body weight, to reduce intake of dietary saturated fat and total fat, to limit sodium intake by avoiding high sodium foods and not adding table salt, and to maintain adequate dietary potassium and calcium preferably from fresh fruits, vegetables, and low-fat dairy products.    stressed the importance of regular exercise  Injury prevention: Discussed safety belts, safety helmets, smoke detector, smoking near bedding or upholstery.   Dental health: Discussed importance of regular tooth brushing, flossing, and dental visits.    NEXT PREVENTATIVE PHYSICAL DUE IN 1 YEAR. No follow-ups on file.

## 2024-02-28 ENCOUNTER — Ambulatory Visit (INDEPENDENT_AMBULATORY_CARE_PROVIDER_SITE_OTHER): Payer: MEDICAID | Admitting: Nurse Practitioner

## 2024-02-28 VITALS — BP 138/80 | HR 85 | Temp 97.6°F | Ht 64.57 in | Wt 162.6 lb

## 2024-02-28 DIAGNOSIS — Z0000 Encounter for general adult medical examination without abnormal findings: Secondary | ICD-10-CM | POA: Diagnosis not present

## 2024-02-28 DIAGNOSIS — E7800 Pure hypercholesterolemia, unspecified: Secondary | ICD-10-CM

## 2024-02-28 DIAGNOSIS — Z23 Encounter for immunization: Secondary | ICD-10-CM | POA: Diagnosis not present

## 2024-02-28 NOTE — Progress Notes (Signed)
 BP 138/80 (BP Location: Right Arm, Patient Position: Sitting, Cuff Size: Normal)   Pulse 85   Temp 97.6 F (36.4 C) (Oral)   Ht 5' 4.57 (1.64 m)   Wt 162 lb 9.6 oz (73.8 kg)   SpO2 99%   BMI 27.42 kg/m    Subjective:    Patient ID: Kelsey Aguirre, female    DOB: 2000/06/01, 23 y.o.   MRN: [id number]  HPI: Kelsey Aguirre is a 23 y.o. female presenting on 02/28/2024 for comprehensive medical examination. Current medical complaints include:none  She currently lives with: Menopausal Symptoms: no  MOOD Patient states she is doing well.  She is now taking Lybalvi and Lithium  and Lamictal.  She was recently started on Lamictal due to some depressive episodes which has helped a lot.  She is also seeing a therapist for anxiety.  Denies SI.  Depression Screen done today and results listed below:     02/28/2024    9:54 AM 08/14/2023    9:34 AM 02/13/2023    9:49 AM 07/13/2022    8:10 AM 01/11/2022    9:52 AM  Depression screen PHQ 2/9  Decreased Interest 0 1 0 0 0  Down, Depressed, Hopeless 0 0 0 0 0  PHQ - 2 Score 0 1 0 0 0  Altered sleeping 1 1 0 2 0  Tired, decreased energy 1 1 2 2  0  Change in appetite 1 1 0 2 2  Feeling bad or failure about yourself  0 0 0 0 0  Trouble concentrating 0 0 0 0 0  Moving slowly or fidgety/restless 0 0 0 0 0  Suicidal thoughts 0 0 0 0 0  PHQ-9 Score 3 4  2  6  2    Difficult doing work/chores Not difficult at all Somewhat difficult Not difficult at all Somewhat difficult Not difficult at all     Data saved with a previous flowsheet row definition    The patient does not have a history of falls. I did complete a risk assessment for falls. A plan of care for falls was documented.   Past Medical History:  Past Medical History:  Diagnosis Date   Allergy    Anxiety    Asthma    Bipolar 1 disorder (HCC)    Depression    Scoliosis     Surgical History:  Past Surgical History:  Procedure Laterality Date   TONSILLECTOMY       Medications:  Current Outpatient Medications on File Prior to Visit  Medication Sig   albuterol  (VENTOLIN  HFA) 108 (90 Base) MCG/ACT inhaler INHALE 2 PUFFS BY MOUTH EVERY 6 HOURS AS NEEDED FOR WHEEZING OR SHORTNESS OF BREATH   amphetamine-dextroamphetamine (ADDERALL XR) 20 MG 24 hr capsule Take 20 mg by mouth daily.   fluticasone  (FLONASE ) 50 MCG/ACT nasal spray SHAKE LIQUID AND USE 1 SPRAY IN EACH NOSTRIL TWICE DAILY   lamoTRIgine (LAMICTAL) 100 MG tablet Take 100 mg by mouth daily.   lithium  carbonate 150 MG capsule Take 1 capsule by mouth daily.   lithium  carbonate 300 MG capsule Take 300 mg by mouth 2 (two) times daily. Take 1 300mg  in the am and 2 300mg  in the pm   LYBALVI 15-10 MG TABS Take 1 tablet by mouth daily.   No current facility-administered medications on file prior to visit.    Allergies:  Allergies  Allergen Reactions   Vraylar [Cariprazine Hcl] Nausea And Vomiting   Zyrtec [Cetirizine] Nausea And Vomiting  Social History:  Social History   Socioeconomic History   Marital status: Single    Spouse name: Not on file   Number of children: Not on file   Years of education: Not on file   Highest education level: Some college, no degree  Occupational History   Not on file  Tobacco Use   Smoking status: Never   Smokeless tobacco: Never  Vaping Use   Vaping status: Never Used  Substance and Sexual Activity   Alcohol use: No   Drug use: No   Sexual activity: Yes    Birth control/protection: Injection  Other Topics Concern   Not on file  Social History Narrative   Not on file   Social Drivers of Health   Financial Resource Strain: Low Risk  (02/28/2024)   Overall Financial Resource Strain (CARDIA)    Difficulty of Paying Living Expenses: Not very hard  Food Insecurity: No Food Insecurity (02/28/2024)   Hunger Vital Sign    Worried About Running Out of Food in the Last Year: Never true    Ran Out of Food in the Last Year: Never true   Transportation Needs: No Transportation Needs (02/28/2024)   PRAPARE - Administrator, Civil Service (Medical): No    Lack of Transportation (Non-Medical): No  Physical Activity: Sufficiently Active (02/28/2024)   Exercise Vital Sign    Days of Exercise per Week: 5 days    Minutes of Exercise per Session: 30 min  Stress: No Stress Concern Present (02/28/2024)   [employer] of Occupational Health - Occupational Stress Questionnaire    Feeling of Stress: Only a little  Social Connections: Socially Isolated (02/28/2024)   Social Connection and Isolation Panel    Frequency of Communication with Friends and Family: More than three times a week    Frequency of Social Gatherings with Friends and Family: Once a week    Attends Religious Services: Never    Database Administrator or Organizations: No    Attends Engineer, Structural: Not on file    Marital Status: Never married  Catering Manager Violence: Not on file   Social History   Tobacco Use  Smoking Status Never  Smokeless Tobacco Never   Social History   Substance and Sexual Activity  Alcohol Use No    Family History:  Family History  Problem Relation Age of Onset   Drug abuse Mother    Hepatitis C Mother    Hypertension Mother    Hepatitis B Father    HIV Father    Heart attack Father    Cancer Maternal Grandmother    Hypertension Maternal Grandmother    Osteoporosis Maternal Grandmother    Heart disease Maternal Grandfather    Lung disease Paternal Grandmother     Past medical history, surgical history, medications, allergies, family history and social history reviewed with patient today and changes made to appropriate areas of the chart.   Review of Systems  Eyes:  Negative for blurred vision and double vision.  Respiratory:  Negative for shortness of breath.   Cardiovascular:  Negative for chest pain, palpitations and leg swelling.  Neurological:  Negative for dizziness and  headaches.  Psychiatric/Behavioral:  Negative for depression and suicidal ideas. The patient is not nervous/anxious.    All other ROS negative except what is listed above and in the HPI.      Objective:    BP 138/80 (BP Location: Right Arm, Patient Position: Sitting, Cuff Size: Normal)  Pulse 85   Temp 97.6 F (36.4 C) (Oral)   Ht 5' 4.57 (1.64 m)   Wt 162 lb 9.6 oz (73.8 kg)   SpO2 99%   BMI 27.42 kg/m   Wt Readings from Last 3 Encounters:  02/28/24 162 lb 9.6 oz (73.8 kg)  08/14/23 157 lb (71.2 kg)  06/23/23 156 lb 3.2 oz (70.9 kg)    Physical Exam Vitals and nursing note reviewed. Exam conducted with a chaperone present Bonney Metro, CMA).  Constitutional:      General: She is awake. She is not in acute distress.    Appearance: Normal appearance. She is well-developed. She is not ill-appearing.  HENT:     Head: Normocephalic and atraumatic.     Right Ear: Hearing, tympanic membrane, ear canal and external ear normal. No drainage.     Left Ear: Hearing, tympanic membrane, ear canal and external ear normal. No drainage.     Nose: Nose normal.     Right Sinus: No maxillary sinus tenderness or frontal sinus tenderness.     Left Sinus: No maxillary sinus tenderness or frontal sinus tenderness.     Mouth/Throat:     Mouth: Mucous membranes are moist.     Pharynx: Oropharynx is clear. Uvula midline. No pharyngeal swelling, oropharyngeal exudate or posterior oropharyngeal erythema.  Eyes:     General: Lids are normal.        Right eye: No discharge.        Left eye: No discharge.     Extraocular Movements: Extraocular movements intact.     Conjunctiva/sclera: Conjunctivae normal.     Pupils: Pupils are equal, round, and reactive to light.     Visual Fields: Right eye visual fields normal and left eye visual fields normal.  Neck:     Thyroid: No thyromegaly.     Vascular: No carotid bruit.     Trachea: Trachea normal.  Cardiovascular:     Rate and Rhythm: Normal  rate and regular rhythm.     Heart sounds: Normal heart sounds. No murmur heard.    No gallop.  Pulmonary:     Effort: Pulmonary effort is normal. No accessory muscle usage or respiratory distress.     Breath sounds: Normal breath sounds.  Chest:  Breasts:    Right: Normal.     Left: Normal.  Abdominal:     General: Bowel sounds are normal.     Palpations: Abdomen is soft. There is no hepatomegaly or splenomegaly.     Tenderness: There is no abdominal tenderness.  Genitourinary:    Vagina: Normal.     Cervix: Normal.     Adnexa: Right adnexa normal and left adnexa normal.  Musculoskeletal:        General: Normal range of motion.     Cervical back: Normal range of motion and neck supple.     Right lower leg: No edema.     Left lower leg: No edema.  Lymphadenopathy:     Head:     Right side of head: No submental, submandibular, tonsillar, preauricular or posterior auricular adenopathy.     Left side of head: No submental, submandibular, tonsillar, preauricular or posterior auricular adenopathy.     Cervical: No cervical adenopathy.     Upper Body:     Right upper body: No supraclavicular, axillary or pectoral adenopathy.     Left upper body: No supraclavicular, axillary or pectoral adenopathy.  Skin:    General: Skin is warm and dry.  Capillary Refill: Capillary refill takes less than 2 seconds.     Findings: No rash.  Neurological:     Mental Status: She is alert and oriented to person, place, and time.     Gait: Gait is intact.     Deep Tendon Reflexes: Reflexes are normal and symmetric.     Reflex Scores:      Brachioradialis reflexes are 2+ on the right side and 2+ on the left side.      Patellar reflexes are 2+ on the right side and 2+ on the left side. Psychiatric:        Attention and Perception: Attention normal.        Mood and Affect: Mood normal.        Speech: Speech normal.        Behavior: Behavior normal. Behavior is cooperative.        Thought Content:  Thought content normal.        Judgment: Judgment normal.     Results for orders placed or performed in visit on 06/23/23  Pregnancy, urine   Collection Time: 06/23/23  3:48 PM  Result Value Ref Range   Preg Test, Ur Negative Negative      Assessment & Plan:   Problem List Items Addressed This Visit   None Visit Diagnoses       Annual physical exam    -  Primary     Elevated LDL cholesterol level         Flu vaccine need       Relevant Orders   Flu vaccine trivalent PF, 6mos and older(Flulaval,Afluria,Fluarix,Fluzone)         Follow up plan: No follow-ups on file.   LABORATORY TESTING:  - Pap smear: Done today  IMMUNIZATIONS:   - Tdap: Tetanus vaccination status reviewed: last tetanus booster within 10 years. - Influenza: Administered today - Pneumovax: Not applicable - Prevnar: Not applicable - COVID: Not applicable - HPV: up to date - Shingrix vaccine: Not applicable  SCREENING: -Mammogram: Not applicable  - Colonoscopy: Not applicable  - Bone Density: Not applicable  -Hearing Test: Not applicable  -Spirometry: Not applicable   PATIENT COUNSELING:   Advised to take 1 mg of folate supplement per day if capable of pregnancy.   Sexuality: Discussed sexually transmitted diseases, partner selection, use of condoms, avoidance of unintended pregnancy  and contraceptive alternatives.   Advised to avoid cigarette smoking.  I discussed with the patient that most people either abstain from alcohol or drink within safe limits (<=14/week and <=4 drinks/occasion for males, <=7/weeks and <= 3 drinks/occasion for females) and that the risk for alcohol disorders and other health effects rises proportionally with the number of drinks per week and how often a drinker exceeds daily limits.  Discussed cessation/primary prevention of drug use and availability of treatment for abuse.   Diet: Encouraged to adjust caloric intake to maintain  or achieve ideal body weight, to  reduce intake of dietary saturated fat and total fat, to limit sodium intake by avoiding high sodium foods and not adding table salt, and to maintain adequate dietary potassium and calcium preferably from fresh fruits, vegetables, and low-fat dairy products.    stressed the importance of regular exercise  Injury prevention: Discussed safety belts, safety helmets, smoke detector, smoking near bedding or upholstery.   Dental health: Discussed importance of regular tooth brushing, flossing, and dental visits.    NEXT PREVENTATIVE PHYSICAL DUE IN 1 YEAR. No follow-ups on file.

## 2024-02-29 ENCOUNTER — Ambulatory Visit: Payer: Self-pay | Admitting: Nurse Practitioner

## 2024-02-29 LAB — LIPID PANEL
Chol/HDL Ratio: 5.1 ratio — ABNORMAL HIGH (ref 0.0–4.4)
Cholesterol, Total: 162 mg/dL (ref 100–199)
HDL: 32 mg/dL — ABNORMAL LOW (ref >39)
LDL Chol Calc (NIH): 110 mg/dL — ABNORMAL HIGH (ref 0–99)
Triglycerides: 108 mg/dL (ref 0–149)
VLDL Cholesterol Cal: 20 mg/dL (ref 5–40)

## 2024-02-29 LAB — CBC WITH DIFFERENTIAL/PLATELET
Basophils Absolute: 0 x10E3/uL (ref 0.0–0.2)
Basos: 0 %
EOS (ABSOLUTE): 0.1 x10E3/uL (ref 0.0–0.4)
Eos: 2 %
Hematocrit: 39.7 % (ref 34.0–46.6)
Hemoglobin: 13.1 g/dL (ref 11.1–15.9)
Immature Grans (Abs): 0 x10E3/uL (ref 0.0–0.1)
Immature Granulocytes: 0 %
Lymphocytes Absolute: 1.1 x10E3/uL (ref 0.7–3.1)
Lymphs: 21 %
MCH: 31 pg (ref 26.6–33.0)
MCHC: 33 g/dL (ref 31.5–35.7)
MCV: 94 fL (ref 79–97)
Monocytes Absolute: 0.4 x10E3/uL (ref 0.1–0.9)
Monocytes: 8 %
Neutrophils Absolute: 3.8 x10E3/uL (ref 1.4–7.0)
Neutrophils: 69 %
Platelets: 250 x10E3/uL (ref 150–450)
RBC: 4.22 x10E6/uL (ref 3.77–5.28)
RDW: 12 % (ref 11.7–15.4)
WBC: 5.5 x10E3/uL (ref 3.4–10.8)

## 2024-02-29 LAB — COMPREHENSIVE METABOLIC PANEL WITH GFR
ALT: 21 IU/L (ref 0–32)
AST: 18 IU/L (ref 0–40)
Albumin: 4.6 g/dL (ref 4.0–5.0)
Alkaline Phosphatase: 90 IU/L (ref 41–116)
BUN/Creatinine Ratio: 11 (ref 9–23)
BUN: 10 mg/dL (ref 6–20)
Bilirubin Total: 0.4 mg/dL (ref 0.0–1.2)
CO2: 20 mmol/L (ref 20–29)
Calcium: 9.6 mg/dL (ref 8.7–10.2)
Chloride: 104 mmol/L (ref 96–106)
Creatinine, Ser: 0.92 mg/dL (ref 0.57–1.00)
Globulin, Total: 2.3 g/dL (ref 1.5–4.5)
Glucose: 102 mg/dL — ABNORMAL HIGH (ref 70–99)
Potassium: 4.1 mmol/L (ref 3.5–5.2)
Sodium: 137 mmol/L (ref 134–144)
Total Protein: 6.9 g/dL (ref 6.0–8.5)
eGFR: 90 mL/min/1.73 (ref >59)

## 2024-02-29 LAB — TSH: TSH: 1.18 u[IU]/mL (ref 0.450–4.500)

## 2024-03-01 LAB — CHLAMYDIA/GONOCOCCUS/TRICHOMONAS, NAA
Chlamydia by NAA: NEGATIVE
Gonococcus by NAA: NEGATIVE
Trich vag by NAA: NEGATIVE

## 2024-04-02 ENCOUNTER — Encounter: Payer: Self-pay | Admitting: Nurse Practitioner

## 2024-04-02 ENCOUNTER — Ambulatory Visit: Payer: MEDICAID | Admitting: Nurse Practitioner

## 2024-04-02 VITALS — BP 106/72 | HR 68 | Temp 97.6°F | Resp 16 | Ht 64.57 in | Wt 159.6 lb

## 2024-04-02 DIAGNOSIS — H6992 Unspecified Eustachian tube disorder, left ear: Secondary | ICD-10-CM | POA: Diagnosis not present

## 2024-04-02 DIAGNOSIS — H6123 Impacted cerumen, bilateral: Secondary | ICD-10-CM

## 2024-04-02 MED ORDER — PREDNISONE 20 MG PO TABS: 40.0000 mg | ORAL_TABLET | Freq: Every day | ORAL | 0 refills | Status: AC

## 2024-04-02 NOTE — Progress Notes (Signed)
 "  BP 106/72 (BP Location: Left Arm, Patient Position: Sitting, Cuff Size: Small)   Pulse 68   Temp 97.6 F (36.4 C) (Oral)   Resp 16   Ht 5' 4.57 (1.64 m)   Wt 159 lb 9.6 oz (72.4 kg)   SpO2 98%   BMI 26.92 kg/m    Subjective:    Patient ID: Kelsey Aguirre, female    DOB: [birth year], 23 y.o.   MRN: [id number]  HPI: Kelsey Aguirre is a 23 y.o. female  Chief Complaint  Patient presents with   other    Ear popping the last couple of days in left ear. Not as bad in the morning but through out the day it starts doing it more and more.   EAR POPPING (LEFT) Noticed this over past couple days. No pain with this. Duration: days Involved ear(s): left Fever: no Otorrhea: no Upper respiratory infection symptoms: no Pruritus: no Hearing loss: no Water immersion no Using Q-tips: no Recurrent otitis media: no Status: stable Treatments attempted: none   Relevant past medical, surgical, family and social history reviewed and updated as indicated. Interim medical history since our last visit reviewed. Allergies and medications reviewed and updated.  Review of Systems  Constitutional:  Negative for activity change, appetite change, diaphoresis, fatigue and fever.  HENT:  Negative for ear discharge and ear pain.   Respiratory:  Negative for cough, chest tightness, shortness of breath and wheezing.   Cardiovascular:  Negative for chest pain, palpitations and leg swelling.  Gastrointestinal: Negative.   Neurological: Negative.   Psychiatric/Behavioral: Negative.      Per HPI unless specifically indicated above     Objective:    BP 106/72 (BP Location: Left Arm, Patient Position: Sitting, Cuff Size: Small)   Pulse 68   Temp 97.6 F (36.4 C) (Oral)   Resp 16   Ht 5' 4.57 (1.64 m)   Wt 159 lb 9.6 oz (72.4 kg)   SpO2 98%   BMI 26.92 kg/m   Wt Readings from Last 3 Encounters:  04/02/24 159 lb 9.6 oz (72.4 kg)  02/28/24 162 lb 9.6 oz (73.8 kg)  08/14/23 157 lb (71.2  kg)    Physical Exam Vitals and nursing note reviewed.  Constitutional:      General: She is awake. She is not in acute distress.    Appearance: She is well-developed and well-groomed. She is not ill-appearing or toxic-appearing.  HENT:     Head: Normocephalic.     Right Ear: Hearing and external ear normal. There is impacted cerumen.     Left Ear: Hearing and external ear normal. There is impacted cerumen.     Ears:     Comments: Both ears with impaction. Irrigation performed by nursing staff with clearance of cerumen both sides. Eyes:     General: Lids are normal.        Right eye: No discharge.        Left eye: No discharge.     Conjunctiva/sclera: Conjunctivae normal.     Pupils: Pupils are equal, round, and reactive to light.  Neck:     Thyroid: No thyromegaly.     Vascular: No carotid bruit.  Cardiovascular:     Rate and Rhythm: Normal rate and regular rhythm.     Heart sounds: Normal heart sounds. No murmur heard.    No gallop.  Pulmonary:     Effort: Pulmonary effort is normal. No accessory muscle usage or respiratory distress.  Breath sounds: Normal breath sounds.  Abdominal:     General: Bowel sounds are normal. There is no distension.     Palpations: Abdomen is soft.     Tenderness: There is no abdominal tenderness.  Musculoskeletal:     Cervical back: Normal range of motion and neck supple.     Right lower leg: No edema.     Left lower leg: No edema.  Lymphadenopathy:     Cervical: No cervical adenopathy.  Skin:    General: Skin is warm and dry.  Neurological:     Mental Status: She is alert and oriented to person, place, and time.     Deep Tendon Reflexes: Reflexes are normal and symmetric.     Reflex Scores:      Brachioradialis reflexes are 2+ on the right side and 2+ on the left side.      Patellar reflexes are 2+ on the right side and 2+ on the left side. Psychiatric:        Attention and Perception: Attention normal.        Mood and Affect: Mood  normal.        Speech: Speech normal.        Behavior: Behavior normal. Behavior is cooperative.        Thought Content: Thought content normal.     Results for orders placed or performed in visit on 02/28/24  Chlamydia/Gonococcus/Trichomonas, NAA([facility])   Collection Time: 02/28/24 10:30 AM   Specimen: Urine   UR  Result Value Ref Range   Chlamydia by NAA Negative Negative   Gonococcus by NAA Negative Negative   Trich vag by NAA Negative Negative  CBC with Differential/Platelet   Collection Time: 02/28/24 10:31 AM  Result Value Ref Range   WBC 5.5 3.4 - 10.8 x10E3/uL   RBC 4.22 3.77 - 5.28 x10E6/uL   Hemoglobin 13.1 11.1 - 15.9 g/dL   Hematocrit 60.2 65.9 - 46.6 %   MCV 94 79 - 97 fL   MCH 31.0 26.6 - 33.0 pg   MCHC 33.0 31.5 - 35.7 g/dL   RDW 87.9 88.2 - 84.5 %   Platelets 250 150 - 450 x10E3/uL   Neutrophils 69 Not Estab. %   Lymphs 21 Not Estab. %   Monocytes 8 Not Estab. %   Eos 2 Not Estab. %   Basos 0 Not Estab. %   Neutrophils Absolute 3.8 1.4 - 7.0 x10E3/uL   Lymphocytes Absolute 1.1 0.7 - 3.1 x10E3/uL   Monocytes Absolute 0.4 0.1 - 0.9 x10E3/uL   EOS (ABSOLUTE) 0.1 0.0 - 0.4 x10E3/uL   Basophils Absolute 0.0 0.0 - 0.2 x10E3/uL   Immature Granulocytes 0 Not Estab. %   Immature Grans (Abs) 0.0 0.0 - 0.1 x10E3/uL  Comprehensive metabolic panel with GFR   Collection Time: 02/28/24 10:31 AM  Result Value Ref Range   Glucose 102 (H) 70 - 99 mg/dL   BUN 10 6 - 20 mg/dL   Creatinine, Ser 9.07 0.57 - 1.00 mg/dL   eGFR 90 >40 fO/fpw/8.26   BUN/Creatinine Ratio 11 9 - 23   Sodium 137 134 - 144 mmol/L   Potassium 4.1 3.5 - 5.2 mmol/L   Chloride 104 96 - 106 mmol/L   CO2 20 20 - 29 mmol/L   Calcium 9.6 8.7 - 10.2 mg/dL   Total Protein 6.9 6.0 - 8.5 g/dL   Albumin 4.6 4.0 - 5.0 g/dL   Globulin, Total 2.3 1.5 - 4.5 g/dL   Bilirubin Total 0.4  0.0 - 1.2 mg/dL   Alkaline Phosphatase 90 41 - 116 IU/L   AST 18 0 - 40 IU/L   ALT 21 0 - 32 IU/L  Lipid panel    Collection Time: 02/28/24 10:31 AM  Result Value Ref Range   Cholesterol, Total 162 100 - 199 mg/dL   Triglycerides 891 0 - 149 mg/dL   HDL 32 (L) >60 mg/dL   VLDL Cholesterol Cal 20 5 - 40 mg/dL   LDL Chol Calc (NIH) 889 (H) 0 - 99 mg/dL   Chol/HDL Ratio 5.1 (H) 0.0 - 4.4 ratio  TSH   Collection Time: 02/28/24 10:31 AM  Result Value Ref Range   TSH 1.180 0.450 - 4.500 uIU/mL      Assessment & Plan:   Problem List Items Addressed This Visit       Nervous and Auditory   Eustachian tube dysfunction, left - Primary   Acute, will send in Prednisone  40 MG daily for 5 days which she has tolerated in past. Recommend no swimming until symptoms improved and no Q tips at home.      Other Visit Diagnoses       Bilateral impacted cerumen       Irrigation performed by nursing staff with full clearance of cerumen.        Follow up plan: Return if symptoms worsen or fail to improve.      "

## 2024-04-02 NOTE — Assessment & Plan Note (Signed)
 Acute, will send in Prednisone  40 MG daily for 5 days which she has tolerated in past. Recommend no swimming until symptoms improved and no Q tips at home.

## 2024-04-02 NOTE — Patient Instructions (Signed)
Ear Irrigation Ear irrigation is a procedure to wash dirt and wax out of your ear canal. It's also called lavage. You may need this if you're having trouble hearing because of wax in your ear. You may also have it done as part of the treatment for an ear infection.  Getting wax and dirt out of your ear can help ear drops work better. Tell a health care provider about: Any allergies you have. All medicines you're taking, including vitamins, herbs, eye drops, creams, and over-the-counter medicines. Any problems you or family members have had with anesthesia. Any bleeding problems you have. Any surgeries you've had. This includes any ear surgeries. Any medical conditions you have, such as any problems with your ear. Whether you're pregnant or may be pregnant. What are the risks? Your health care provider will talk with you about risks. These may include: Infection. Pain. Loss of hearing. Fluid and debris being pushed into your middle ear. This can happen if there are holes in your eardrum. The procedure not working. Trauma to your ear. Feeling dizzy, light-headed, or nauseous. What happens before the procedure? You'll talk with your provider about the procedure and plan. You may be given ear drops to put in your ear 15-20 minutes before the procedure. This helps loosen the wax. What happens during the procedure?  A syringe will be filled with water or a saline solution. Saline is made of salt and water. The syringe will be gently put into your ear. The fluid will be used to wash out wax and other debris. The procedure may vary among providers and hospitals. What can I expect after the procedure? Follow the instructions given to you by your provider. Follow these instructions at home: Using ear irrigation kits In some cases, you can use an ear irrigation kit at home. Ask your provider if this is an option for you. Use the kit only as told by your provider. Read the instructions on the  package. Follow the directions for using the syringe. Use water that's at room temperature. Do not use an ear irrigation kit if: You have diabetes. This can make you more likely to get an infection. You have a hole or tear in your eardrum. You have tubes in your ears. You've had ear surgery before. You've been told not to irrigate your ears. Cleaning your ears  Clean the outside of your ear with a soft washcloth each day. If told by your provider, use a few drops of baby oil, mineral oil, glycerin, hydrogen peroxide, or earwax softening drops. Do not use cotton swabs to clean your ears. These can push wax down into the ear canal. Do not put things into your ears to try to get rid of wax. This includes ear candles. General instructions Take over-the-counter and prescription medicines only as told by your provider. If you were prescribed antibiotics, use them as told by your provider. Do not stop using the antibiotic even if you start to feel better. Keep your ear clean and dry as told by your provider. See your provider at least once a year to have your ears and hearing checked. Contact a health care provider if: Your hearing isn't getting better. Your hearing is getting worse. You have pain or redness in your ear. You feel dizzy. You have ringing in your ears. You have nausea or vomiting. This information is not intended to replace advice given to you by your health care provider. Make sure you discuss any questions you have with  your health care provider. Document Revised: 06/09/2022 Document Reviewed: 06/09/2022 Elsevier Patient Education  2024 [employer].

## 2024-04-24 ENCOUNTER — Ambulatory Visit: Payer: Self-pay

## 2024-04-24 NOTE — Telephone Encounter (Signed)
 FYI Only or Action Required?: FYI only for provider: appointment scheduled on 04/25/24.  Patient was last seen in primary care on 04/02/2024 by Valerio Melanie DASEN, NP.  Called Nurse Triage reporting Cough and Nasal Congestion.  Symptoms began a week ago.  Interventions attempted: Prescription medications: Flonase .  Symptoms are: gradually worsening.  Triage Disposition: See Physician Within 24 Hours  Patient/caregiver understands and will follow disposition?: Yes   1 week ago onset of cough with runny nose and congestion. Clear mucus. No CP or SOB. No fever. Severe coughing at night, mild to moderate during the day. Scheduled appt with PCP tomorrow. Advised UC or ED for worsening symptoms.    Copied from CRM [id number]. Topic: Clinical - Red Word Triage >> Apr 24, 2024  2:38 PM Fonda T wrote: Red Word that prompted transfer to Nurse Triage: Pt calling reports she has a cough and nasal congestion, symptoms started on Wednesday, last week, productive cough worsening, with thick mucous, usually in the mornings. Reason for Disposition  SEVERE coughing spells (e.g., whooping sound after coughing, vomiting after coughing)  Answer Assessment - Initial Assessment Questions 1. ONSET: When did the cough begin?      1 week ago  2. SEVERITY: How bad is the cough today?      Severe at night, mild-moderate during the day  3. SPUTUM: Describe the color of your sputum (e.g., none, dry cough; clear, white, yellow, green)     Clear, small amount  4. HEMOPTYSIS: Are you coughing up any blood? If Yes, ask: How much? (e.g., flecks, streaks, tablespoons, etc.)     Denies  5. DIFFICULTY BREATHING: Are you having difficulty breathing? If Yes, ask: How bad is it? (e.g., mild, moderate, severe)      Denies  6. FEVER: Do you have a fever? If Yes, ask: What is your temperature, how was it measured, and when did it start?     Denies  7. CARDIAC HISTORY: Do you have any history of  heart disease? (e.g., heart attack, congestive heart failure)      Denies  8. LUNG HISTORY: Do you have any history of lung disease?  (e.g., pulmonary embolus, asthma, emphysema)     Asthma, not flared up currently  9. PE RISK FACTORS: Do you have a history of blood clots? (or: recent major surgery, recent prolonged travel, bedridden)     Denies  10. OTHER SYMPTOMS: Do you have any other symptoms? (e.g., runny nose, wheezing, chest pain)       Congestion, runny nose  11. PREGNANCY: Is there any chance you are pregnant? When was your last menstrual period?       Denies. Has IUD.  Protocols used: Cough - Acute Productive-A-AH

## 2024-04-25 ENCOUNTER — Encounter: Payer: Self-pay | Admitting: Nurse Practitioner

## 2024-04-25 ENCOUNTER — Ambulatory Visit (INDEPENDENT_AMBULATORY_CARE_PROVIDER_SITE_OTHER): Payer: MEDICAID | Admitting: Nurse Practitioner

## 2024-04-25 VITALS — BP 127/79 | HR 98 | Temp 97.4°F | Ht 64.57 in | Wt 160.6 lb

## 2024-04-25 DIAGNOSIS — R051 Acute cough: Secondary | ICD-10-CM | POA: Diagnosis not present

## 2024-04-25 MED ORDER — AZITHROMYCIN 250 MG PO TABS: ORAL_TABLET | ORAL | 0 refills | Status: AC

## 2024-04-25 NOTE — Progress Notes (Signed)
 "  BP 127/79 (BP Location: Right Arm, Patient Position: Sitting, Cuff Size: Normal)   Pulse 98   Temp (!) 97.4 F (36.3 C) (Oral)   Ht 5' 4.57 (1.64 m)   Wt 160 lb 9.6 oz (72.8 kg)   SpO2 99%   BMI 27.08 kg/m    Subjective:    Patient ID: Kelsey Aguirre, female    DOB: 12-05-2000, 24 y.o.   MRN: [id number]  HPI: Kelsey Aguirre is a 23 y.o. female  Chief Complaint  Patient presents with   Cough   Nasal Congestion    Patient stated it started last Wednesday and by Friday it was flu blow congestion, cough, and runny nose. Postnasal drip in throat, phlegm, mucous cough up(mostly white/clear)   UPPER RESPIRATORY TRACT INFECTION Worst symptom: Symptoms started last week Fever: no Cough: yes Shortness of breath: yes Wheezing: no Chest pain: no Chest tightness: no Chest congestion: no Nasal congestion: yes Runny nose: yes Post nasal drip: yes Sneezing: no Sore throat: no Swollen glands: no Sinus pressure: no Headache: no Face pain: no Toothache: no Ear pain: no bilateral Ear pressure: no bilateral Eyes red/itching:no Eye drainage/crusting: no  Vomiting: no Rash: no Fatigue: no Sick contacts: no Strep contacts: no  Context: better Recurrent sinusitis: no Relief with OTC cold/cough medications: no  Treatments attempted: none   Relevant past medical, surgical, family and social history reviewed and updated as indicated. Interim medical history since our last visit reviewed. Allergies and medications reviewed and updated.  Review of Systems  Constitutional:  Negative for fatigue and fever.  HENT:  Positive for congestion, postnasal drip and rhinorrhea. Negative for dental problem, ear pain, sinus pressure, sinus pain, sneezing and sore throat.   Respiratory:  Positive for cough. Negative for shortness of breath and wheezing.   Cardiovascular:  Negative for chest pain.  Gastrointestinal:  Negative for vomiting.  Skin:  Negative for rash.  Neurological:   Negative for headaches.    Per HPI unless specifically indicated above     Objective:    BP 127/79 (BP Location: Right Arm, Patient Position: Sitting, Cuff Size: Normal)   Pulse 98   Temp (!) 97.4 F (36.3 C) (Oral)   Ht 5' 4.57 (1.64 m)   Wt 160 lb 9.6 oz (72.8 kg)   SpO2 99%   BMI 27.08 kg/m   Wt Readings from Last 3 Encounters:  04/25/24 160 lb 9.6 oz (72.8 kg)  04/02/24 159 lb 9.6 oz (72.4 kg)  02/28/24 162 lb 9.6 oz (73.8 kg)    Physical Exam Vitals and nursing note reviewed.  Constitutional:      General: She is not in acute distress.    Appearance: Normal appearance. She is normal weight. She is not ill-appearing, toxic-appearing or diaphoretic.  HENT:     Head: Normocephalic.     Right Ear: External ear normal. A middle ear effusion is present.     Left Ear: External ear normal. A middle ear effusion is present.     Nose: Congestion and rhinorrhea present.     Mouth/Throat:     Mouth: Mucous membranes are moist.     Pharynx: Oropharynx is clear. Posterior oropharyngeal erythema present.  Eyes:     General:        Right eye: No discharge.        Left eye: No discharge.     Extraocular Movements: Extraocular movements intact.     Conjunctiva/sclera: Conjunctivae normal.     Pupils:  Pupils are equal, round, and reactive to light.  Cardiovascular:     Rate and Rhythm: Normal rate and regular rhythm.     Heart sounds: No murmur heard. Pulmonary:     Effort: Pulmonary effort is normal. No respiratory distress.     Breath sounds: Normal breath sounds. No wheezing or rales.  Musculoskeletal:     Cervical back: Normal range of motion and neck supple.  Skin:    General: Skin is warm and dry.     Capillary Refill: Capillary refill takes less than 2 seconds.  Neurological:     General: No focal deficit present.     Mental Status: She is alert and oriented to person, place, and time. Mental status is at baseline.  Psychiatric:        Mood and Affect: Mood normal.         Behavior: Behavior normal.        Thought Content: Thought content normal.        Judgment: Judgment normal.     Results for orders placed or performed in visit on 02/28/24  Chlamydia/Gonococcus/Trichomonas, NAA([facility])   Collection Time: 02/28/24 10:30 AM   Specimen: Urine   UR  Result Value Ref Range   Chlamydia by NAA Negative Negative   Gonococcus by NAA Negative Negative   Trich vag by NAA Negative Negative  CBC with Differential/Platelet   Collection Time: 02/28/24 10:31 AM  Result Value Ref Range   WBC 5.5 3.4 - 10.8 x10E3/uL   RBC 4.22 3.77 - 5.28 x10E6/uL   Hemoglobin 13.1 11.1 - 15.9 g/dL   Hematocrit 60.2 65.9 - 46.6 %   MCV 94 79 - 97 fL   MCH 31.0 26.6 - 33.0 pg   MCHC 33.0 31.5 - 35.7 g/dL   RDW 87.9 88.2 - 84.5 %   Platelets 250 150 - 450 x10E3/uL   Neutrophils 69 Not Estab. %   Lymphs 21 Not Estab. %   Monocytes 8 Not Estab. %   Eos 2 Not Estab. %   Basos 0 Not Estab. %   Neutrophils Absolute 3.8 1.4 - 7.0 x10E3/uL   Lymphocytes Absolute 1.1 0.7 - 3.1 x10E3/uL   Monocytes Absolute 0.4 0.1 - 0.9 x10E3/uL   EOS (ABSOLUTE) 0.1 0.0 - 0.4 x10E3/uL   Basophils Absolute 0.0 0.0 - 0.2 x10E3/uL   Immature Granulocytes 0 Not Estab. %   Immature Grans (Abs) 0.0 0.0 - 0.1 x10E3/uL  Comprehensive metabolic panel with GFR   Collection Time: 02/28/24 10:31 AM  Result Value Ref Range   Glucose 102 (H) 70 - 99 mg/dL   BUN 10 6 - 20 mg/dL   Creatinine, Ser 9.07 0.57 - 1.00 mg/dL   eGFR 90 >40 fO/fpw/8.26   BUN/Creatinine Ratio 11 9 - 23   Sodium 137 134 - 144 mmol/L   Potassium 4.1 3.5 - 5.2 mmol/L   Chloride 104 96 - 106 mmol/L   CO2 20 20 - 29 mmol/L   Calcium 9.6 8.7 - 10.2 mg/dL   Total Protein 6.9 6.0 - 8.5 g/dL   Albumin 4.6 4.0 - 5.0 g/dL   Globulin, Total 2.3 1.5 - 4.5 g/dL   Bilirubin Total 0.4 0.0 - 1.2 mg/dL   Alkaline Phosphatase 90 41 - 116 IU/L   AST 18 0 - 40 IU/L   ALT 21 0 - 32 IU/L  Lipid panel   Collection Time: 02/28/24 10:31 AM   Result Value Ref Range   Cholesterol, Total 162 100 -  199 mg/dL   Triglycerides 891 0 - 149 mg/dL   HDL 32 (L) >60 mg/dL   VLDL Cholesterol Cal 20 5 - 40 mg/dL   LDL Chol Calc (NIH) 889 (H) 0 - 99 mg/dL   Chol/HDL Ratio 5.1 (H) 0.0 - 4.4 ratio  TSH   Collection Time: 02/28/24 10:31 AM  Result Value Ref Range   TSH 1.180 0.450 - 4.500 uIU/mL      Assessment & Plan:   Problem List Items Addressed This Visit   None Visit Diagnoses       Acute cough    -  Primary   Will treat with azithromycin . Complete course of medication.  Follow up if not improved.        Follow up plan: No follow-ups on file.      "

## 2024-08-27 ENCOUNTER — Ambulatory Visit: Payer: MEDICAID | Admitting: Nurse Practitioner
